# Patient Record
Sex: Female | Born: 1985 | Race: Black or African American | Hispanic: No | Marital: Single | Smoking: Never smoker
Health system: Southern US, Community
[De-identification: ages and names within clinical notes are randomized; demographics above are authoritative.]

## PROBLEM LIST (undated history)

## (undated) ENCOUNTER — Inpatient Hospital Stay (HOSPITAL_COMMUNITY): Payer: Self-pay

## (undated) DIAGNOSIS — F329 Major depressive disorder, single episode, unspecified: Secondary | ICD-10-CM

## (undated) DIAGNOSIS — G43909 Migraine, unspecified, not intractable, without status migrainosus: Secondary | ICD-10-CM

## (undated) DIAGNOSIS — F32A Depression, unspecified: Secondary | ICD-10-CM

## (undated) DIAGNOSIS — F419 Anxiety disorder, unspecified: Secondary | ICD-10-CM

## (undated) HISTORY — PX: NO PAST SURGERIES: SHX2092

---

## 2009-05-20 ENCOUNTER — Inpatient Hospital Stay (HOSPITAL_COMMUNITY): Admission: AD | Admit: 2009-05-20 | Discharge: 2009-05-20 | Payer: Self-pay | Admitting: Obstetrics & Gynecology

## 2009-06-05 ENCOUNTER — Inpatient Hospital Stay (HOSPITAL_COMMUNITY): Admission: AD | Admit: 2009-06-05 | Discharge: 2009-06-05 | Payer: Self-pay | Admitting: Obstetrics & Gynecology

## 2009-06-05 ENCOUNTER — Ambulatory Visit: Payer: Self-pay | Admitting: Advanced Practice Midwife

## 2009-10-21 ENCOUNTER — Ambulatory Visit: Payer: Self-pay | Admitting: Advanced Practice Midwife

## 2009-10-21 ENCOUNTER — Other Ambulatory Visit: Payer: Self-pay | Admitting: Obstetrics & Gynecology

## 2009-10-21 ENCOUNTER — Inpatient Hospital Stay (HOSPITAL_COMMUNITY): Admission: AD | Admit: 2009-10-21 | Discharge: 2009-10-21 | Payer: Self-pay | Admitting: Obstetrics & Gynecology

## 2009-10-27 ENCOUNTER — Inpatient Hospital Stay (HOSPITAL_COMMUNITY): Admission: AD | Admit: 2009-10-27 | Discharge: 2009-10-27 | Payer: Self-pay | Admitting: Obstetrics & Gynecology

## 2009-10-28 ENCOUNTER — Inpatient Hospital Stay (HOSPITAL_COMMUNITY): Admission: AD | Admit: 2009-10-28 | Discharge: 2009-10-28 | Payer: Self-pay | Admitting: Obstetrics & Gynecology

## 2009-10-28 ENCOUNTER — Ambulatory Visit: Payer: Self-pay | Admitting: Family Medicine

## 2009-10-29 ENCOUNTER — Inpatient Hospital Stay (HOSPITAL_COMMUNITY): Admission: AD | Admit: 2009-10-29 | Discharge: 2009-10-30 | Payer: Self-pay | Admitting: Obstetrics & Gynecology

## 2009-10-30 ENCOUNTER — Inpatient Hospital Stay (HOSPITAL_COMMUNITY): Admission: AD | Admit: 2009-10-30 | Discharge: 2009-10-30 | Payer: Self-pay | Admitting: Obstetrics and Gynecology

## 2009-10-30 ENCOUNTER — Ambulatory Visit: Payer: Self-pay | Admitting: Obstetrics and Gynecology

## 2009-10-31 ENCOUNTER — Inpatient Hospital Stay (HOSPITAL_COMMUNITY): Admission: AD | Admit: 2009-10-31 | Discharge: 2009-11-03 | Payer: Self-pay | Admitting: Obstetrics & Gynecology

## 2009-10-31 ENCOUNTER — Ambulatory Visit: Payer: Self-pay | Admitting: Obstetrics and Gynecology

## 2011-04-01 LAB — CBC
HCT: 33.8 % — ABNORMAL LOW (ref 36.0–46.0)
Hemoglobin: 8.7 g/dL — ABNORMAL LOW (ref 12.0–15.0)
MCHC: 33.8 g/dL (ref 30.0–36.0)
MCHC: 33.9 g/dL (ref 30.0–36.0)
MCV: 85.2 fL (ref 78.0–100.0)
MCV: 86 fL (ref 78.0–100.0)
RBC: 2.99 MIL/uL — ABNORMAL LOW (ref 3.87–5.11)
RBC: 3.97 MIL/uL (ref 3.87–5.11)
WBC: 12.9 10*3/uL — ABNORMAL HIGH (ref 4.0–10.5)

## 2011-04-01 LAB — RPR: RPR Ser Ql: NONREACTIVE

## 2011-04-02 LAB — CBC
HCT: 32.7 % — ABNORMAL LOW (ref 36.0–46.0)
WBC: 8.8 10*3/uL (ref 4.0–10.5)

## 2011-04-02 LAB — URINE CULTURE

## 2011-04-02 LAB — URINALYSIS, ROUTINE W REFLEX MICROSCOPIC
Specific Gravity, Urine: <1.005 — ABNORMAL LOW (ref 1.005–1.030)
Urobilinogen, UA: 0.2 mg/dL (ref 0.0–1.0)

## 2011-04-02 LAB — DIFFERENTIAL
Basophils Relative: 0 % (ref 0–1)
Lymphocytes Relative: 13 % (ref 12–46)
Monocytes Relative: 9 % (ref 3–12)
Neutrophils Relative %: 75 % (ref 43–77)

## 2011-04-02 LAB — RAPID URINE DRUG SCREEN, HOSP PERFORMED
Amphetamines: NOT DETECTED
Barbiturates: NOT DETECTED
Benzodiazepines: NOT DETECTED
Cocaine: NOT DETECTED
Opiates: NOT DETECTED

## 2011-04-02 LAB — TYPE AND SCREEN: ABO/RH(D): O POS

## 2011-04-02 LAB — SICKLE CELL SCREEN: Sickle Cell Screen: NEGATIVE

## 2011-04-02 LAB — HEPATITIS B SURFACE ANTIGEN: Hepatitis B Surface Ag: NEGATIVE

## 2011-04-02 LAB — URINE MICROSCOPIC-ADD ON

## 2011-04-02 LAB — WET PREP, GENITAL: Trich, Wet Prep: NONE SEEN

## 2011-04-02 LAB — RUBELLA SCREEN: Rubella: 18.7 IU/mL — ABNORMAL HIGH

## 2011-04-02 LAB — RPR: RPR Ser Ql: NONREACTIVE

## 2011-04-02 LAB — STREP B DNA PROBE

## 2011-04-02 LAB — RAPID HIV SCREEN (WH-MAU): Rapid HIV Screen: NONREACTIVE

## 2011-04-02 LAB — GC/CHLAMYDIA PROBE AMP, GENITAL
Chlamydia, DNA Probe: POSITIVE — AB
GC Probe Amp, Genital: NEGATIVE

## 2011-04-07 LAB — URINALYSIS, ROUTINE W REFLEX MICROSCOPIC
Bilirubin Urine: NEGATIVE
Glucose, UA: NEGATIVE mg/dL
Hgb urine dipstick: NEGATIVE
Specific Gravity, Urine: 1.015 (ref 1.005–1.030)

## 2011-04-07 LAB — WET PREP, GENITAL

## 2015-06-06 ENCOUNTER — Emergency Department (HOSPITAL_COMMUNITY)
Admission: EM | Admit: 2015-06-06 | Discharge: 2015-06-06 | Disposition: A | Discharge status: Home / Self Care | Payer: Medicaid Other | Attending: Emergency Medicine | Admitting: Emergency Medicine

## 2015-06-06 ENCOUNTER — Encounter (HOSPITAL_COMMUNITY): Payer: Self-pay | Admitting: Emergency Medicine

## 2015-06-06 DIAGNOSIS — R51 Headache: Secondary | ICD-10-CM | POA: Diagnosis present

## 2015-06-06 DIAGNOSIS — G43011 Migraine without aura, intractable, with status migrainosus: Secondary | ICD-10-CM | POA: Diagnosis not present

## 2015-06-06 HISTORY — DX: Migraine, unspecified, not intractable, without status migrainosus: G43.909

## 2015-06-06 MED ORDER — DIPHENHYDRAMINE HCL 50 MG/ML IJ SOLN
25.0000 mg | Freq: Once | INTRAMUSCULAR | Status: AC
  Administered 2015-06-06: 25 mg via INTRAVENOUS
  Filled 2015-06-06: qty 1

## 2015-06-06 MED ORDER — KETOROLAC TROMETHAMINE 30 MG/ML IJ SOLN
30.0000 mg | Freq: Once | INTRAMUSCULAR | Status: AC
  Administered 2015-06-06: 30 mg via INTRAVENOUS
  Filled 2015-06-06: qty 1

## 2015-06-06 MED ORDER — METOCLOPRAMIDE HCL 5 MG/ML IJ SOLN
10.0000 mg | Freq: Once | INTRAMUSCULAR | Status: AC
  Administered 2015-06-06: 10 mg via INTRAVENOUS
  Filled 2015-06-06: qty 2

## 2015-06-06 MED ORDER — DEXAMETHASONE SODIUM PHOSPHATE 10 MG/ML IJ SOLN
10.0000 mg | Freq: Once | INTRAMUSCULAR | Status: AC
  Administered 2015-06-06: 10 mg via INTRAVENOUS
  Filled 2015-06-06: qty 1

## 2015-06-06 NOTE — ED Notes (Signed)
Per EMS: pt c/o HA on both sides that is similar to severe migraine that pt has hx of; pt alert at present; pt sts took excedrin without relief

## 2015-06-06 NOTE — Discharge Instructions (Signed)

## 2015-06-06 NOTE — ED Provider Notes (Addendum)
CSN: [id number]     Arrival date & time 06/06/15  1238 History   First MD Initiated Contact with Patient 06/06/15 1248     Chief Complaint  Patient presents with  . Headache     (Consider location/radiation/quality/duration/timing/severity/associated sxs/prior Treatment) Patient is a 29 y.o. female presenting with headaches. The history is provided by the patient.  Headache Pain location:  Generalized Quality:  Dull Radiates to:  Does not radiate Severity currently:  10/10 Severity at highest:  10/10 Onset quality:  Gradual Duration:  1 day Timing:  Constant Progression:  Worsening Chronicity:  Recurrent Similar to prior headaches: yes   Context: bright light   Relieved by:  Nothing Worsened by:  Light and sound Ineffective treatments: excedrin. Associated symptoms: nausea and photophobia   Associated symptoms: no abdominal pain, no back pain, no blurred vision, no fever, no focal weakness, no visual change and no vomiting   Risk factors comment:  Hx of migraines   Past Medical History  Diagnosis Date  . Migraine headache    History reviewed. No pertinent past surgical history. History reviewed. No pertinent family history. History  Substance Use Topics  . Smoking status: Never Smoker   . Smokeless tobacco: Not on file  . Alcohol Use: No   OB History    No data available     Review of Systems  Constitutional: Negative for fever.  Eyes: Positive for photophobia. Negative for blurred vision.  Gastrointestinal: Positive for nausea. Negative for vomiting and abdominal pain.  Musculoskeletal: Negative for back pain.  Neurological: Positive for headaches. Negative for focal weakness.  All other systems reviewed and are negative.     Allergies  Review of patient's allergies indicates no known allergies.  Home Medications   Prior to Admission medications   Not on File   BP 110/75 mmHg  Temp(Src) 97.9 F (36.6 C) (Oral)  Resp 21  SpO2 100% Physical Exam   Constitutional: She is oriented to person, place, and time. She appears well-developed and well-nourished. No distress.  HENT:  Head: Normocephalic and atraumatic.  Mouth/Throat: Oropharynx is clear and moist.  Eyes: Conjunctivae and EOM are normal. Pupils are equal, round, and reactive to light. Right eye exhibits no discharge. Left eye exhibits no discharge.  photophobia  Neck: Normal range of motion. Neck supple. No spinous process tenderness present. No rigidity. No Brudzinski's sign and no Kernig's sign noted.  Cardiovascular: Normal rate, normal heart sounds and intact distal pulses.   No murmur heard. Pulmonary/Chest: Effort normal and breath sounds normal. No respiratory distress. She has no wheezes. She has no rales.  Abdominal: Soft. She exhibits no distension. There is no tenderness.  Musculoskeletal: Normal range of motion. She exhibits no edema or tenderness.  Lymphadenopathy:    She has no cervical adenopathy.  Neurological: She is alert and oriented to person, place, and time. She has normal strength. No cranial nerve deficit or sensory deficit. Coordination and gait normal. GCS eye subscore is 4. GCS verbal subscore is 5. GCS motor subscore is 6.  Skin: Skin is warm and dry.  Psychiatric: She has a normal mood and affect. Her behavior is normal.  Nursing note and vitals reviewed.   ED Course  Procedures (including critical care time) Labs Review Labs Reviewed - No data to display  Imaging Review No results found.   EKG Interpretation None      MDM   Final diagnoses:  Intractable migraine without aura and with status migrainosus    Pt  with typical migraine HA without sx suggestive of SAH(sudden onset, worst of life, or deficits), infection, or cavernous vein thrombosis.  Normal neuro exam and vital signs. Will give HA cocktail and on re-eval.  2:54 PM Pt feeling much better after headache cocktail.  Gwyneth Sprout, MD 06/06/15 1455  Gwyneth Sprout,  MD 06/06/15 1457

## 2015-07-15 ENCOUNTER — Encounter (HOSPITAL_COMMUNITY): Payer: Self-pay | Admitting: Emergency Medicine

## 2015-07-15 ENCOUNTER — Emergency Department (HOSPITAL_COMMUNITY): Payer: Medicaid Other

## 2015-07-15 ENCOUNTER — Emergency Department (HOSPITAL_COMMUNITY)
Admission: EM | Admit: 2015-07-15 | Discharge: 2015-07-16 | Disposition: A | Discharge status: Home / Self Care | Payer: Medicaid Other | Attending: Emergency Medicine | Admitting: Emergency Medicine

## 2015-07-15 DIAGNOSIS — N730 Acute parametritis and pelvic cellulitis: Secondary | ICD-10-CM | POA: Insufficient documentation

## 2015-07-15 DIAGNOSIS — Z3202 Encounter for pregnancy test, result negative: Secondary | ICD-10-CM | POA: Diagnosis not present

## 2015-07-15 DIAGNOSIS — Z8679 Personal history of other diseases of the circulatory system: Secondary | ICD-10-CM | POA: Diagnosis not present

## 2015-07-15 DIAGNOSIS — R109 Unspecified abdominal pain: Secondary | ICD-10-CM

## 2015-07-15 DIAGNOSIS — R1032 Left lower quadrant pain: Secondary | ICD-10-CM | POA: Diagnosis present

## 2015-07-15 DIAGNOSIS — R000 Tachycardia, unspecified: Secondary | ICD-10-CM | POA: Diagnosis not present

## 2015-07-15 HISTORY — DX: Acute parametritis and pelvic cellulitis: N73.0

## 2015-07-15 LAB — WET PREP, GENITAL
Trich, Wet Prep: NONE SEEN
Yeast Wet Prep HPF POC: NONE SEEN

## 2015-07-15 LAB — CBC WITH DIFFERENTIAL/PLATELET
Basophils Absolute: 0 10*3/uL (ref 0.0–0.1)
Basophils Relative: 0 % (ref 0–1)
EOS ABS: 0 10*3/uL (ref 0.0–0.7)
Eosinophils Relative: 0 % (ref 0–5)
HEMATOCRIT: 35.5 % — AB (ref 36.0–46.0)
HEMOGLOBIN: 11.7 g/dL — AB (ref 12.0–15.0)
LYMPHS PCT: 5 % — AB (ref 12–46)
Lymphs Abs: 0.8 10*3/uL (ref 0.7–4.0)
MCH: 27.2 pg (ref 26.0–34.0)
MCHC: 33 g/dL (ref 30.0–36.0)
MCV: 82.6 fL (ref 78.0–100.0)
Monocytes Absolute: 1.2 10*3/uL — ABNORMAL HIGH (ref 0.1–1.0)
Monocytes Relative: 8 % (ref 3–12)
Neutro Abs: 12.5 10*3/uL — ABNORMAL HIGH (ref 1.7–7.7)
Neutrophils Relative %: 87 % — ABNORMAL HIGH (ref 43–77)
Platelets: 185 10*3/uL (ref 150–400)
RBC: 4.3 MIL/uL (ref 3.87–5.11)
RDW: 14.7 % (ref 11.5–15.5)
WBC: 14.5 10*3/uL — ABNORMAL HIGH (ref 4.0–10.5)

## 2015-07-15 LAB — URINALYSIS, ROUTINE W REFLEX MICROSCOPIC
BILIRUBIN URINE: NEGATIVE
Glucose, UA: NEGATIVE mg/dL
Ketones, ur: NEGATIVE mg/dL
NITRITE: POSITIVE — AB
PH: 5.5 (ref 5.0–8.0)
Protein, ur: NEGATIVE mg/dL
Urobilinogen, UA: 0.2 mg/dL (ref 0.0–1.0)

## 2015-07-15 LAB — COMPREHENSIVE METABOLIC PANEL
ALBUMIN: 3.8 g/dL (ref 3.5–5.0)
ALT: 10 U/L — AB (ref 14–54)
ANION GAP: 7 (ref 5–15)
AST: 15 U/L (ref 15–41)
Alkaline Phosphatase: 65 U/L (ref 38–126)
BILIRUBIN TOTAL: 0.7 mg/dL (ref 0.3–1.2)
BUN: <5 mg/dL — ABNORMAL LOW (ref 6–20)
CHLORIDE: 105 mmol/L (ref 101–111)
CO2: 26 mmol/L (ref 22–32)
Calcium: 9.5 mg/dL (ref 8.9–10.3)
Creatinine, Ser: 0.88 mg/dL (ref 0.44–1.00)
GFR calc non Af Amer: >60 mL/min (ref >60)
GLUCOSE: 104 mg/dL — AB (ref 65–99)
Potassium: 3.4 mmol/L — ABNORMAL LOW (ref 3.5–5.1)
SODIUM: 138 mmol/L (ref 135–145)
Total Protein: 7.6 g/dL (ref 6.5–8.1)

## 2015-07-15 LAB — I-STAT BETA HCG BLOOD, ED (MC, WL, AP ONLY): I-stat hCG, quantitative: <5 m[IU]/mL (ref <5)

## 2015-07-15 LAB — URINE MICROSCOPIC-ADD ON

## 2015-07-15 LAB — I-STAT CG4 LACTIC ACID, ED: LACTIC ACID, VENOUS: 1.79 mmol/L (ref 0.5–2.0)

## 2015-07-15 MED ORDER — SODIUM CHLORIDE 0.9 % IV BOLUS (SEPSIS)
1000.0000 mL | Freq: Once | INTRAVENOUS | Status: AC
  Administered 2015-07-15: 1000 mL via INTRAVENOUS

## 2015-07-15 MED ORDER — DEXTROSE 5 % IV SOLN
100.0000 mg | Freq: Once | INTRAVENOUS | Status: AC
  Administered 2015-07-15: 100 mg via INTRAVENOUS
  Filled 2015-07-15: qty 100

## 2015-07-15 MED ORDER — OXYCODONE-ACETAMINOPHEN 5-325 MG PO TABS
2.0000 | ORAL_TABLET | Freq: Once | ORAL | Status: AC
  Administered 2015-07-15: 2 via ORAL
  Filled 2015-07-15: qty 2

## 2015-07-15 MED ORDER — ONDANSETRON HCL 4 MG/2ML IJ SOLN
4.0000 mg | Freq: Once | INTRAMUSCULAR | Status: AC
  Administered 2015-07-15: 4 mg via INTRAVENOUS
  Filled 2015-07-15: qty 2

## 2015-07-15 MED ORDER — IOHEXOL 300 MG/ML  SOLN: 25.0000 mL | Freq: Once | INTRAMUSCULAR | Status: AC | PRN

## 2015-07-15 MED ORDER — DEXTROSE 5 % IV SOLN
2.0000 g | Freq: Once | INTRAVENOUS | Status: AC
  Administered 2015-07-15: 2 g via INTRAVENOUS
  Filled 2015-07-15: qty 2

## 2015-07-15 MED ORDER — ONDANSETRON HCL 4 MG PO TABS
4.0000 mg | ORAL_TABLET | Freq: Once | ORAL | Status: AC
  Administered 2015-07-15: 4 mg via ORAL
  Filled 2015-07-15: qty 1

## 2015-07-15 MED ORDER — IOHEXOL 300 MG/ML  SOLN
100.0000 mL | Freq: Once | INTRAMUSCULAR | Status: AC | PRN
  Administered 2015-07-15: 100 mL via INTRAVENOUS

## 2015-07-15 MED ORDER — IBUPROFEN 200 MG PO TABS
600.0000 mg | ORAL_TABLET | Freq: Once | ORAL | Status: AC
  Administered 2015-07-15: 600 mg via ORAL
  Filled 2015-07-15: qty 3

## 2015-07-15 MED ORDER — DEXTROSE 5 % IV SOLN
1.0000 g | Freq: Once | INTRAVENOUS | Status: DC
  Filled 2015-07-15: qty 10

## 2015-07-15 MED ORDER — HYDROMORPHONE HCL 1 MG/ML IJ SOLN
1.0000 mg | Freq: Once | INTRAMUSCULAR | Status: AC
  Administered 2015-07-15: 1 mg via INTRAVENOUS
  Filled 2015-07-15: qty 1

## 2015-07-15 NOTE — ED Provider Notes (Signed)
  Physical Exam  BP 112/61 mmHg  Pulse 93  Temp(Src) 98.8 F (37.1 C) (Oral)  Resp 20  Ht 5\' 4"  (1.626 m)  Wt 200 lb (90.719 kg)  BMI 34.31 kg/m2  SpO2 95%  LMP 07/03/2015  Physical Exam  Constitutional: She is oriented to person, place, and time. She appears well-developed and well-nourished. No distress.  HENT:  Head: Normocephalic and atraumatic.  Mouth/Throat: Oropharynx is clear and moist. No oropharyngeal exudate.  Eyes: Right eye exhibits no discharge. Left eye exhibits no discharge. No scleral icterus.  Neck: Normal range of motion.  Cardiovascular: Normal rate, regular rhythm and normal heart sounds.   No murmur heard. Pulmonary/Chest: Effort normal and breath sounds normal. No respiratory distress.  Abdominal: Soft. There is tenderness in the left lower quadrant.  Genitourinary:  Pelvic exam deferred-- done by previous provider during this visit.  Musculoskeletal: Normal range of motion. She exhibits no edema or tenderness.  Neurological: She is alert and oriented to person, place, and time. No cranial nerve deficit. Coordination normal.  Skin: Skin is warm and dry. No rash noted. She is not diaphoretic.  Psychiatric: She has a normal mood and affect.    ED Course  Procedures  MDM Patient signed out to me by Rhea BleacherJosh Geiple, PA-C with plan to follow-up on pelvic ultrasound, possibly admit for symptomatic control.  Patient is a 29 year old female who presented to the ER, and has had workup for left lower quadrant abdominal pain and fever. Patient's workup so far shows patient to have a leukocytosis of 14, initially with a fever of 103. Clinical appearance favor his PID versus pyelonephritis despite mild focal pyelonephritis on CT. Pelvic exam was remarkable for significant cervical motion tenderness. This is correlated with CT abdomen pelvis findings of free fluid in adnexal regions representing possible hydrator salpinx. Patient is followed up with pelvic ultrasound to rule  out TOA.  Pelvic ultrasound impression: 1. Findings support pelvic inflammatory disease. There is moderate amount pelvic free fluid. There is a heterogeneous echogenicity focal area adjacent to the left ovary with increased blood flow appears inflammatory. No defined tubo-ovarian abscess. No evidence of a dilated fallopian tube. 2. Normal uterus. Dominant complex appearing cyst in the right ovary likely involuting/partly ruptured follicular cyst or hemorrhagic cyst. 3. Normal uterus.  I discussed patient's case, laboratory findings, imaging and exam with OB/GYN Dr. Lynetta MareAnyawu MD who recommends to discharge patient from the ED to home on PO abx if pt is tolerating PO well.    Pt received IV doxycycline and Cefoxitin, tolerating PO well.  Symptoms effectively managed here.  VS have improved, fever is down.  No concern for sepsis or SIRS at this point.  Pt hemodynamically stable and in no acute distress.  Pt stable for discharge.  Pt discharged with symptomatic therapy and doxycycline BID x14 days.  Pt strongly encouraged [facility].  Strict return precautions discussed, pt verbalized understanding and agreement of this plan.    BP 112/61 mmHg  Pulse 93  Temp(Src) 98.8 F (37.1 C) (Oral)  Resp 20  Ht 5\' 4"  (1.626 m)  Wt 200 lb (90.719 kg)  BMI 34.31 kg/m2  SpO2 95%  LMP 07/03/2015  Signed,  Ladona MowJoe Jesiah Yerby, PA-C 12:46 AM  Patient seen and discussed with Dr. Derwood KaplanAnkit Nanavati, MD      Ladona MowJoe Diem Dicocco, PA-C 07/16/15 [phone]

## 2015-07-15 NOTE — ED Notes (Signed)
Patient returned to Xray.

## 2015-07-15 NOTE — ED Provider Notes (Signed)
CSN: [id number]     Arrival date & time 07/15/15  1310 History   First MD Initiated Contact with Patient 07/15/15 1357     Chief Complaint  Patient presents with  . Abdominal Pain     (Consider location/radiation/quality/duration/timing/severity/associated sxs/prior Treatment) HPI Comments: Patient with no past surgical hsitory -- presents with c/o abdominal pain which began acutely 1 week ago as a mild LLQ pain. Over the week, pain gradually worsened and became severe this morning. Pain is now all over her abdomen. She has developed a fever and has associated nausea, but denies V/D. Also associated HA. No urinary sx. LMP 07/03/15 and was normal for her. Last sexual intercourse was 4/16 and she denies vaginal bleeding or discharge. Patient takes ibuprofen about 2x/day and this has provided some relief. No h/o PUD, gallbladder disease, kidney stones. Patient states she has been ignoring her symptoms because she has two small children to care for at home and today could no longer take the symptoms.   The history is provided by the patient.    Past Medical History  Diagnosis Date  . Migraine headache    History reviewed. No pertinent past surgical history. No family history on file. History  Substance Use Topics  . Smoking status: Never Smoker   . Smokeless tobacco: Not on file  . Alcohol Use: No   OB History    No data available     Review of Systems  Constitutional: Positive for fever and chills.  HENT: Negative for rhinorrhea and sore throat.   Eyes: Negative for redness.  Respiratory: Negative for cough.   Cardiovascular: Negative for chest pain.  Gastrointestinal: Positive for nausea and abdominal pain. Negative for vomiting and diarrhea.  Genitourinary: Negative for dysuria, frequency, hematuria, vaginal bleeding and vaginal discharge.  Musculoskeletal: Negative for myalgias.  Skin: Negative for rash.  Neurological: Negative for headaches.      Allergies  Review of  patient's allergies indicates no known allergies.  Home Medications   Prior to Admission medications   Medication Sig Start Date End Date Taking? Authorizing Provider  ibuprofen (ADVIL,MOTRIN) 200 MG tablet Take 600 mg by mouth every 6 (six) hours as needed for mild pain or moderate pain.   Yes Historical Provider, MD   BP 113/58 mmHg  Pulse 102  Temp(Src) 102.6 F (39.2 C) (Oral)  Resp 22  Ht 5\' 4"  (1.626 m)  Wt 200 lb (90.719 kg)  BMI 34.31 kg/m2  SpO2 99%  LMP 07/03/2015 Physical Exam  Constitutional: She appears well-developed and well-nourished. She appears distressed (crying during exam).  HENT:  Head: Normocephalic and atraumatic.  Mouth/Throat: Oropharynx is clear and moist.  Eyes: Conjunctivae are normal. Right eye exhibits no discharge. Left eye exhibits no discharge.  Neck: Normal range of motion. Neck supple.  Cardiovascular: Regular rhythm and normal heart sounds.  Tachycardia present.   Pulmonary/Chest: Effort normal and breath sounds normal. No respiratory distress. She has no wheezes. She has no rales.  Abdominal: Soft. She exhibits no distension. There is tenderness. There is no rebound and no guarding.  Generalized moderate to severe pain  Genitourinary: Uterus is tender. Cervix exhibits motion tenderness and discharge. Cervix exhibits no friability. Right adnexum displays tenderness. Right adnexum displays no mass. Left adnexum displays tenderness. Left adnexum displays no mass. No erythema in the vagina. Vaginal discharge (thick white) found.  L > R adnexal tenderness with + CMT  Neurological: She is alert.  Skin: Skin is warm and dry.  Psychiatric: She  has a normal mood and affect.  Nursing note and vitals reviewed.   ED Course  Procedures (including critical care time) Labs Review Labs Reviewed  CBC WITH DIFFERENTIAL/PLATELET - Abnormal; Notable for the following:    WBC 14.5 (*)    Hemoglobin 11.7 (*)    HCT 35.5 (*)    Neutrophils Relative % 87 (*)     Neutro Abs 12.5 (*)    Lymphocytes Relative 5 (*)    Monocytes Absolute 1.2 (*)    All other components within normal limits  COMPREHENSIVE METABOLIC PANEL - Abnormal; Notable for the following:    Potassium 3.4 (*)    Glucose, Bld 104 (*)    BUN <5 (*)    ALT 10 (*)    All other components within normal limits  CULTURE, BLOOD (ROUTINE X 2)  CULTURE, BLOOD (ROUTINE X 2)  WET PREP, GENITAL  URINE CULTURE  URINALYSIS, ROUTINE W REFLEX MICROSCOPIC (NOT AT [facility])  LIPASE, BLOOD  I-STAT CG4 LACTIC ACID, ED  I-STAT BETA HCG BLOOD, ED ([facility], [facility], [facility] ONLY)  POC URINE PREG, ED  GC/CHLAMYDIA PROBE AMP ([employer]) NOT AT [facility]    Imaging Review Ct Abdomen Pelvis W Contrast  07/15/2015   CLINICAL DATA:  Left-sided abdominal pain and headache.  Nausea.  EXAM: CT ABDOMEN AND PELVIS WITH CONTRAST  TECHNIQUE: Multidetector CT imaging of the abdomen and pelvis was performed using the standard protocol following bolus administration of intravenous contrast.  CONTRAST:  OMNIPAQUE IOHEXOL 300 MG/ML  SOLN  COMPARISON:  None.  FINDINGS: Lower chest:  Normal.  Hepatobiliary: Normal.  Pancreas: Normal.  Spleen: Normal.  Adrenals/Urinary Tract: There is a 2 cm area of abnormal poorly defined low-density at the anterior aspect of the lower pole of the left kidney with soft tissue stranding of the perinephric fat and along the inferior aspect of Gerota's fascia consistent with focal pyelonephritis.  The adrenal glands and right kidney are normal. Bladder is normal. Ureters are not distended.  Stomach/Bowel: Normal.  Vascular/Lymphatic: Normal.  Reproductive: There is a small amount free fluid in the pelvis with areas of fluid density in the adnexa. The possibility of hydrosalpinx should be considered. Uterus is normal.  Other: No free air.  Musculoskeletal: Normal.  IMPRESSION: 1. Focal pyelonephritis of the lower pole of the left kidney. 2. Small amount of free fluid in the pelvis with fluid in the adnexal  regions which may represent hydrosalpinges.   Electronically Signed   By: Francene Boyers M.D.   On: 07/15/2015 16:04     EKG Interpretation None       3:37 PM Patient seen and examined. Work-up initiated. Medications ordered.   Vital signs reviewed and are as follows: BP 113/58 mmHg  Pulse 102  Temp(Src) 102.6 F (39.2 C) (Oral)  Resp 22  Ht  (1.626 m)  Wt 200 lb (90.719 kg)  BMI 34.31 kg/m2  SpO2 99%  LMP 07/03/2015  4:56 PM Pelvic performed with nurse chaperone.   Handoff to [facility] PA-C/Nanavati at shift change. Suspect PID. [place] ordered to eval for TOA. May need admission for severe PID. Feel pyelo less likely.   MDM   Final diagnoses:  Abdominal pain  PID (acute pelvic inflammatory disease)   Pending completion of work-up.     Renne Crigler, PA-C 07/15/15 1658  Mancel Bale, MD 07/15/15 [phone]

## 2015-07-15 NOTE — ED Notes (Signed)
Patient states abdominal pain and headache x 1 week.   Patient states she took ibuprofen and it eased some of the pain.   Patient states has had some nausea, but denies vomiting and diarrhea.

## 2015-07-16 LAB — GC/CHLAMYDIA PROBE AMP (~~LOC~~) NOT AT ARMC
Chlamydia: POSITIVE — AB
Neisseria Gonorrhea: NEGATIVE

## 2015-07-16 MED ORDER — DOXYCYCLINE HYCLATE 100 MG PO CAPS: 100.0000 mg | ORAL_CAPSULE | Freq: Two times a day (BID) | ORAL | Status: DC

## 2015-07-16 MED ORDER — OXYCODONE-ACETAMINOPHEN 5-325 MG PO TABS: 1.0000 | ORAL_TABLET | Freq: Four times a day (QID) | ORAL | Status: DC | PRN

## 2015-07-16 MED ORDER — ONDANSETRON HCL 4 MG PO TABS: 4.0000 mg | ORAL_TABLET | Freq: Four times a day (QID) | ORAL | Status: DC

## 2015-07-16 NOTE — ED Notes (Signed)
Patient is alert and orientedx4.  Patient was explained discharge instructions and they understood them with no questions.   

## 2015-07-16 NOTE — Discharge Instructions (Signed)
Pelvic Inflammatory Disease °Pelvic inflammatory disease (PID) refers to an infection in some or all of the female organs. The infection can be in the uterus, ovaries, fallopian tubes, or the surrounding tissues in the pelvis. PID can cause abdominal or pelvic pain that comes on suddenly (acute pelvic pain). PID is a serious infection because it can lead to lasting (chronic) pelvic pain or the inability to have children (infertile).  °CAUSES  °The infection is often caused by the normal bacteria found in the vaginal tissues. PID may also be caused by an infection that is spread during sexual contact. PID can also occur following:  °· The birth of a baby.   °· A miscarriage.   °· An abortion.   °· Major pelvic surgery.   °· The use of an intrauterine device (IUD).   °· A sexual assault.   °RISK FACTORS °Certain factors can put a person at higher risk for PID, such as: °· Being younger than 25 years. °· Being sexually active at a young age. °· Using nonbarrier contraception. °· Having multiple sexual partners. °· Having sex with someone who has symptoms of a genital infection. °· Using oral contraception. °Other times, certain behaviors can increase the possibility of getting PID, such as: °· Having sex during your period. °· Using a vaginal douche. °· Having an intrauterine device (IUD) in place. °SYMPTOMS  °· Abdominal or pelvic pain.   °· Fever.   °· Chills.   °· Abnormal vaginal discharge. °· Abnormal uterine bleeding.   °· Unusual pain shortly after finishing your period. °DIAGNOSIS  °Your caregiver will choose some of the following methods to make a diagnosis, such as:  °· Performing a physical exam and history. A pelvic exam typically reveals a very tender uterus and surrounding pelvis.   °· Ordering laboratory tests including a pregnancy test, blood tests, and urine test.  °· Ordering cultures of the vagina and cervix to check for a sexually transmitted infection (STI). °· Performing an ultrasound.    °· Performing a laparoscopic procedure to look inside the pelvis.   °TREATMENT  °· Antibiotic medicines may be prescribed and taken by mouth.   °· Sexual partners may be treated when the infection is caused by a sexually transmitted disease (STD).   °· Hospitalization may be needed to give antibiotics intravenously. °· Surgery may be needed, but this is rare. °It may take weeks until you are completely well. If you are diagnosed with PID, you should also be checked for human immunodeficiency virus (HIV).   °HOME CARE INSTRUCTIONS  °· If given, take your antibiotics as directed. Finish the medicine even if you start to feel better.   °· Only take over-the-counter or prescription medicines for pain, discomfort, or fever as directed by your caregiver.   °· Do not have sexual intercourse until treatment is completed or as directed by your caregiver. If PID is confirmed, your recent sexual partner(s) will need treatment.   °· Keep your follow-up appointments. °SEEK MEDICAL CARE IF:  °· You have increased or abnormal vaginal discharge.   °· You need prescription medicine for your pain.   °· You vomit.   °· You cannot take your medicines.   °· Your partner has an STD.   °SEEK IMMEDIATE MEDICAL CARE IF:  °· You have a fever.   °· You have increased abdominal or pelvic pain.   °· You have chills.   °· You have pain when you urinate.   °· You are not better after 72 hours following treatment.   °MAKE SURE YOU:  °· Understand these instructions. °· Will watch your condition. °· Will get help right away if you are not doing well or get worse. °  Document Released: 12/14/2005 Document Revised: 04/10/2013 Document Reviewed: 12/10/2011 °ExitCare® Patient Information ©2015 [employer]. This information is not intended to replace advice given to you by your health care provider. Make sure you discuss any questions you have with your health care provider. ° ° °Emergency Department Resource Guide °1) Find a Doctor and Pay Out of  Pocket °Although you won't have to find out who is covered by your insurance plan, it is a good idea to ask around and get recommendations. You will then need to call the office and see if the doctor you have chosen will accept you as a new patient and what types of options they offer for patients who are self-pay. Some doctors offer discounts or will set up payment plans for their patients who do not have insurance, but you will need to ask so you aren't surprised when you get to your appointment. ° °2) Contact Your Local Health Department °Not all health departments have doctors that can see patients for sick visits, but many do, so it is worth a call to see if yours does. If you don't know where your local health department is, you can check in your phone book. The CDC also has a tool to help you locate your state's health department, and many state websites also have listings of all of their local health departments. ° °3) Find a Walk-in Clinic °If your illness is not likely to be very severe or complicated, you may want to try a walk in clinic. These are popping up all over the country in pharmacies, drugstores, and shopping centers. They're usually staffed by nurse practitioners or physician assistants that have been trained to treat common illnesses and complaints. They're usually fairly quick and inexpensive. However, if you have serious medical issues or chronic medical problems, these are probably not your best option. ° °No Primary Care Doctor: °- Call [facility] at  832-8000 - they can help you locate a primary care doctor that  accepts your insurance, provides certain services, etc. °- Physician Referral Service- 1-800-533-3463 ° °Chronic Pain Problems: °Organization         Address  Phone   Notes  °[facility]  (336) 297-2271 Patients need to be referred by their primary care doctor.  ° °Medication Assistance: °Organization         Address  Phone   Notes  °[place]  [facility] [address] °[place], [place] [address] (336) 641-8030 --Must be a resident of [place] °-- Must have NO insurance coverage whatsoever (no Medicaid/ Medicare, etc.) °-- The pt. MUST have a primary care doctor that directs their care regularly and follows them in the community °  °[facility]  (866) 331-1348   °[employer]  (888) 892-1162   ° °Agencies that provide inexpensive medical care: °Organization         Address  Phone   Notes  °[facility]  (336) 832-8035   °[facility]    (336) 832-7272   °[facility] [address] °[place], [place] [address] (336) 832-4777   °[facility] [address], °[place] (336) 271-4999   °[facility]    (336) 373-0678   °[facility]    (336) 272-1050   °[facility] ° [address], [place] Phone:  (336) 832-4444, Fax:  (336) 832-4440 Hours of Operation:  9 am - 6 pm, M-F.    Also accepts Medicaid/Medicare and self-pay.  °[facility] ° [address], [place] Phone: (336) 832-3150, Fax: (336) 832-3151. Hours of Operation:  8:30 am - 5:30 pm, M-F.  Also accepts Medicaid and self-pay.  °[facility] [place] [address], [place] Phone: (336) 878-6027   °[facility] [address], [place], [place] (336)723-1848, Ext. 123 Mondays & Thursdays: 7-9 AM.  First 15 patients are seen on a first come, first serve basis. °  ° °Medicaid-accepting [place] Providers: ° °Organization         Address  Phone   Notes  °[facility] [address], [place] (336) 641-2100 Also accepts self-pay patients.  °[facility] [address], [place] ° (336) 856-9996   °[facility] [address], [place] (336) 288-8857   °[facility] [address] [place]  [address], [place] (336) 299-7000   °Veita Bland [address], [place]  ° (336) 373-1557 Only accepts [place] Access Medicaid patients after they have their name applied to their card.  ° °Self-Pay (no insurance) in [place]: ° °Organization         Address  Phone   Notes  °Sickle Cell Patients, [facility] [address], [place] (336) 832-1970   °[facility] [address], [place] (336) 832-4400   °[facility] ° [address], [place] (336) 992-4800   °[facility]/Dr. Osei-Bonsu ° [address] [place] [address], [place] or [address], [place] (336) 841-8500 Phone number for both [place] and [place] locations is the same.  °[facility] [address], [place] (336) 299-0000   °[facility] [address] [place] [address], [place] or [address] (336) 852-7530 °(336) 878-2260   °[facility] [address], [place] (336) 350-1642, phone; (336) 294-5005, fax Sees patients 1st and 3rd Saturday of every month.  Must not qualify for public or private insurance (i.e. Medicaid, Medicare, [place] Health Choice, Veterans' Benefits) • Household income should be no more than 200% of the poverty level •The clinic cannot treat you if you are pregnant or think you are pregnant • Sexually transmitted diseases are not treated at the clinic.  ° ° °Dental Care: °Organization         Address  Phone  Notes  °[facility] [facility] [address],  (336) 641-6152 Accepts children up to age 21 who are enrolled in Medicaid or [place] Health Choice; pregnant women with a Medicaid card; and children who have applied for Medicaid or [place] Health Choice, but were declined, whose parents can pay a reduced fee at time of service.  °[facility] [place]  [address], [place]  (336) 641-7733 Accepts children up to age 21 who are enrolled in Medicaid or [place] Health Choice; pregnant women with a Medicaid card; and children who have applied for Medicaid or  Health Choice, but were declined, whose parents can pay a reduced fee at time of service.  °[facility] ° [address],  (336) 641-4533 Patients are seen by appointment only. Walk-ins are not accepted. [facility] will see patients 18 years of age and older. °Monday - Tuesday (  8am-5pm) °Most Wednesdays (8:30-5pm) °$30 per visit, cash only  °[facility] ° [address], [place] (336) 641-4533 Patients are seen by appointment only. Walk-ins are not accepted. [facility] will see patients 18 years of age and older. °One Wednesday Evening (Monthly: Volunteer Based).  $30 per visit, cash only  °[facility]  (919) 537-3737 for adults; Children under age 4, call Graduate Pediatric Dentistry at ([phone]) [phone]. Children aged 4-14, please call ([phone]) [phone] to request a pediatric application. ° Dental services are provided in all areas of dental care including fillings, crowns and bridges, complete and partial dentures, implants, gum treatment, root canals, and extractions. Preventive care is also provided. Treatment is provided to both adults and children. °Patients are selected via a lottery and there is often a waiting list. °  °[facility] [address], °[place] ° (336) 763-8833 [email or web address] °  °[facility] [address], [place], [place] (336)723-1848, Ext. 123 Second and Fourth Thursday of each month, opens at 6:30 AM; Clinic ends at 9 AM.  Patients are seen on a first-come first-served basis, and a limited number are seen during each clinic.  ° °[facility] ° [address], [place], [place] (336) 723-7904   Eligibility Requirements °You must have lived in [place], or [place]  counties for at least the last three months. °  You cannot be eligible for state or federal sponsored healthcare insurance, including Veterans Administration, Medicaid, or Medicare. °  You generally cannot be eligible for healthcare insurance through your employer.  °  How to apply: °Eligibility screenings are held every Tuesday and Wednesday afternoon from 1:00 pm until 4:00 pm. You do not need an appointment for the interview!  °[facility] [address], [place], [place] 336-631-2330   °[facility]  336-342-8273   °[place]  336-703-3100   °[facility]  336-570-6415   ° °Behavioral Health Resources in the Community: °Intensive Outpatient Programs °Organization         Address  Phone  Notes  °[facility] [address], [place], [place] 336-878-6098   °[facility] [address], [place], [place] 336-832-9800   °[facility] [address], [place], [place] ° 336-882-2125   °[facility] [address],  °[place], [place] 1-800-853-5163 or 336-641-4981   °Substance Abuse Resources °Organization         Address  Phone  Notes  °[facility]  336-882-2125   °[facility]  336-784-9470   °[facility]  336-285-9073   °[facility]  336-845-3988   °Residential & Outpatient Substance Abuse Program  1-800-659-3381   °Psychological Services °Organization         Address  Phone  Notes  °[facility]  336- 832-9600   °[facility]  336- 378-7881   °[facility] [address], [place] 1-800-853-5163 or 336-641-4981   ° °Mobile Crisis Teams °Organization         Address  Phone  Notes  °[facility], Mobile Crisis Care Unit  1-877-626-1772   °Assertive °[facility] ° [address] [place], [place] 336-834-9664   °Sharon DeEsch [address] °[place] 336-554-5454   ° °Self-Help/Support Groups °Organization         Address  Phone               Notes  °[facility] [place] - variety of support groups  336- 373-1402 Call for more information  °Narcotics Anonymous (NA), [facility] [address], °[place]  2 meetings at this location  ° °Residential Treatment Programs °Organization         Address  Phone  Notes  °[facility] [address],    °[place]  1-866-801-8205   °[facility] ° [address], [place], [place] 704-293-8524   °[facility] [address], [place] 336-845-3988 Admissions: 8am-3pm M-F  °[facility] [address]    °[place], [place] 336-841-1104   °[facility] [address], [place], [place] 336-379-7146   °[facility] [address]  °[place], [place] 336-285-9073   °[facility] Intensive Outpatient [address], [place], [place] 336-852-3033   °[facility] [address]  °[place], [place] 1-877-615-2722 or 336-784-9470   °[facility] [address] , [place] 336-227-7417 Accepts Medicaid  °[facility] [address]  °[place] 1-800-659-3381 Substance Abuse/Addiction Treatment  ° °[place] Behavioral Health Resources °Organization         Address  Phone  Notes  °[facility]  (888) 581-9988   °Julie Brannon, PhD [address] [place], [place]   (336) 349-5553 or (336) 951-0000   °[facility]   [address] °[place], [place] (336) 349-4454   °[facility] [address], [place], [place] (336) 342-8316 Insurance/Medicaid/sponsorship through [facility]  °[facility] [address]                                    [place], [place] (336) 342-8316 Therapy/tele-psych/case  °[facility] [address]  ° [place],  (336) 349-2233    °Dr. Arfeen  (336)  349-4544   °[facility] [place]  [employer] [place] [facility] 1) [address], [place] °2) [address], [place] °3)  [address], [place] (336) 349-3220 °(336) 342-7768 ° °(336) 342-8140   °[place] [facility] (336) 342-1394 or (336) 342-3537 (After Hours)    ° ° ° ° °

## 2015-07-17 ENCOUNTER — Telehealth (HOSPITAL_COMMUNITY): Payer: Self-pay

## 2015-07-17 LAB — URINE CULTURE

## 2015-07-17 NOTE — ED Notes (Signed)
Positive for chlamydia. Treated per protocol. DHHS form faxed. Attempting to contact pt 

## 2015-07-18 ENCOUNTER — Telehealth: Payer: Self-pay | Admitting: Emergency Medicine

## 2015-07-18 NOTE — Telephone Encounter (Signed)
Post ED Visit - Positive Culture Follow-up  Culture report reviewed by antimicrobial stewardship pharmacist:  Wes Dulaney, Pharm.D., BCPS  Celedonio Miyamoto, Pharm.D., BCPS  Georgina Pillion, Pharm.D., BCPS  [place], [place].D., BCPS, AAHIVP  Estella Husk, Pharm.D., BCPS, AAHIVP  Elder [place], [address].D., BCPS  Positive Urine culture Treated with Doxycycline, organism sensitive to the same and no further patient follow-up is required at this time.  Jiles Harold 07/18/2015, 5:53 PM

## 2015-07-20 LAB — CULTURE, BLOOD (ROUTINE X 2)
Culture: NO GROWTH
Culture: NO GROWTH

## 2015-07-24 ENCOUNTER — Telehealth: Payer: Self-pay | Admitting: Emergency Medicine

## 2015-07-24 NOTE — Telephone Encounter (Signed)
Patient returned call. ID verified x three. Notified of positive Chlamydia and that treatment was given while in ED. STD instructions provided, patient verbalized understanding.

## 2015-10-10 ENCOUNTER — Encounter (HOSPITAL_COMMUNITY): Payer: Self-pay | Admitting: Emergency Medicine

## 2015-10-10 ENCOUNTER — Emergency Department (HOSPITAL_COMMUNITY): Payer: Medicaid Other

## 2015-10-10 ENCOUNTER — Emergency Department (HOSPITAL_COMMUNITY)
Admission: EM | Admit: 2015-10-10 | Discharge: 2015-10-10 | Disposition: A | Discharge status: Home / Self Care | Payer: Medicaid Other | Attending: Emergency Medicine | Admitting: Emergency Medicine

## 2015-10-10 DIAGNOSIS — R55 Syncope and collapse: Secondary | ICD-10-CM | POA: Diagnosis not present

## 2015-10-10 DIAGNOSIS — O9989 Other specified diseases and conditions complicating pregnancy, childbirth and the puerperium: Secondary | ICD-10-CM | POA: Insufficient documentation

## 2015-10-10 DIAGNOSIS — Z79899 Other long term (current) drug therapy: Secondary | ICD-10-CM | POA: Diagnosis not present

## 2015-10-10 DIAGNOSIS — Z8679 Personal history of other diseases of the circulatory system: Secondary | ICD-10-CM | POA: Diagnosis not present

## 2015-10-10 DIAGNOSIS — Z3A01 Less than 8 weeks gestation of pregnancy: Secondary | ICD-10-CM | POA: Diagnosis not present

## 2015-10-10 DIAGNOSIS — Z3490 Encounter for supervision of normal pregnancy, unspecified, unspecified trimester: Secondary | ICD-10-CM

## 2015-10-10 LAB — URINALYSIS, ROUTINE W REFLEX MICROSCOPIC
BILIRUBIN URINE: NEGATIVE
Glucose, UA: NEGATIVE mg/dL
Hgb urine dipstick: NEGATIVE
Ketones, ur: 15 mg/dL — AB
Nitrite: NEGATIVE
PROTEIN: NEGATIVE mg/dL
Specific Gravity, Urine: 1.026 (ref 1.005–1.030)
UROBILINOGEN UA: 0.2 mg/dL (ref 0.0–1.0)
pH: 6 (ref 5.0–8.0)

## 2015-10-10 LAB — CBC
HEMATOCRIT: 35.2 % — AB (ref 36.0–46.0)
Hemoglobin: 11.5 g/dL — ABNORMAL LOW (ref 12.0–15.0)
MCH: 27.3 pg (ref 26.0–34.0)
MCHC: 32.7 g/dL (ref 30.0–36.0)
MCV: 83.6 fL (ref 78.0–100.0)
PLATELETS: 206 10*3/uL (ref 150–400)
RBC: 4.21 MIL/uL (ref 3.87–5.11)
RDW: 14.4 % (ref 11.5–15.5)
WBC: 8.1 10*3/uL (ref 4.0–10.5)

## 2015-10-10 LAB — BASIC METABOLIC PANEL
Anion gap: 9 (ref 5–15)
BUN: 8 mg/dL (ref 6–20)
CO2: 25 mmol/L (ref 22–32)
CREATININE: 0.81 mg/dL (ref 0.44–1.00)
Calcium: 9.7 mg/dL (ref 8.9–10.3)
Chloride: 104 mmol/L (ref 101–111)
GFR calc Af Amer: >60 mL/min (ref >60)
Glucose, Bld: 82 mg/dL (ref 65–99)
POTASSIUM: 3.3 mmol/L — AB (ref 3.5–5.1)
SODIUM: 138 mmol/L (ref 135–145)

## 2015-10-10 LAB — CBG MONITORING, ED
GLUCOSE-CAPILLARY: 78 mg/dL (ref 65–99)
GLUCOSE-CAPILLARY: 86 mg/dL (ref 65–99)

## 2015-10-10 LAB — URINE MICROSCOPIC-ADD ON

## 2015-10-10 LAB — WET PREP, GENITAL
Trich, Wet Prep: NONE SEEN
Yeast Wet Prep HPF POC: NONE SEEN

## 2015-10-10 LAB — I-STAT BETA HCG BLOOD, ED (MC, WL, AP ONLY): I-stat hCG, quantitative: >2000 m[IU]/mL — ABNORMAL HIGH (ref <5)

## 2015-10-10 MED ORDER — POTASSIUM CHLORIDE CRYS ER 20 MEQ PO TBCR
40.0000 meq | EXTENDED_RELEASE_TABLET | Freq: Once | ORAL | Status: AC
  Administered 2015-10-10: 40 meq via ORAL
  Filled 2015-10-10: qty 2

## 2015-10-10 MED ORDER — PRENATAL VITAMIN 27-0.8 MG PO TABS: 1.0000 | ORAL_TABLET | Freq: Every day | ORAL | Status: DC

## 2015-10-10 NOTE — ED Notes (Signed)
Taxi voucher provided to pt per MD request.

## 2015-10-10 NOTE — ED Notes (Signed)
Reviewed discharge instructions and prescription medication with patient, who verbalized understanding. Patient given cab voucher to get home since the last bus has already run and she has no family/friends in the area.

## 2015-10-10 NOTE — ED Notes (Signed)
Pt reports Tuesday she had a syncopal episode after being outside with her kids. Pt c/o pain to L cheek. She is not on blood thinners. Today while at school pt became dizzy and felt like she was going to pass out again. She denies cp/sob/palpitations, fevers/chills. Denies weakness/numbness.

## 2015-10-10 NOTE — Discharge Instructions (Signed)
Ultrasound shows that you're pregnant. Avoid marijuana. Call the women's health clinic tomorrow to schedule an appointment. You should have a repeat ultrasound and hCG test in 14 days. Take the vitamins prescribed as directed

## 2015-10-10 NOTE — ED Provider Notes (Signed)
CSN: [id number]     Arrival date & time 10/10/15  1554 History   First MD Initiated Contact with Patient 10/10/15 1752     Chief Complaint  Patient presents with  . Loss of Consciousness     (Consider location/radiation/quality/duration/timing/severity/associated sxs/prior Treatment) HPI Patient had a syncopal event 2 days ago. She fell, hitting her left cheek. Today she became transiently for 1 or 2 minutes. She did not fall today. She is presently asymptomatic without treatment. She denies having had any headache chest pain shortness of breath or abdominal pain. Last normal menstrual period beginning of September 2016. No abnormal vaginal bleeding. No treatment prior to coming here. Past Medical History  Diagnosis Date  . Migraine headache    History reviewed. No pertinent past surgical history. No family history on file. Social History  Substance Use Topics  . Smoking status: Never Smoker   . Smokeless tobacco: None  . Alcohol Use: No   positive marijuana use OB History    No data available     Review of Systems  Constitutional: Negative.   HENT: Negative.   Respiratory: Negative.   Cardiovascular: Negative.        Syncope  Gastrointestinal: Negative.   Musculoskeletal: Negative.   Skin: Negative.   Neurological: Negative.   Psychiatric/Behavioral: Negative.   All other systems reviewed and are negative.     Allergies  Review of patient's allergies indicates no known allergies.  Home Medications   Prior to Admission medications   Medication Sig Start Date End Date Taking? Authorizing Provider  doxycycline (VIBRAMYCIN) 100 MG capsule Take 1 capsule (100 mg total) by mouth 2 (two) times daily. One po bid x 14 days 07/16/15   Ladona Mow, PA-C  ibuprofen (ADVIL,MOTRIN) 200 MG tablet Take 600 mg by mouth every 6 (six) hours as needed for mild pain or moderate pain.    Historical Provider, MD  ondansetron (ZOFRAN) 4 MG tablet Take 1 tablet (4 mg total) by mouth every  6 (six) hours. 07/16/15   Ladona Mow, PA-C  oxyCODONE-acetaminophen (PERCOCET) 5-325 MG per tablet Take 1-2 tablets by mouth every 6 (six) hours as needed. 07/16/15   Ladona Mow, PA-C   BP 113/70 mmHg  Pulse 108  Temp(Src) 98.7 F (37.1 C) (Oral)  Resp 18  Ht  (1.626 m)  Wt 206 lb 4.8 oz (93.577 kg)  BMI 35.39 kg/m2  SpO2 100%  LMP 08/29/2015 Physical Exam  Constitutional: She appears well-developed and well-nourished.  HENT:  Head: Normocephalic and atraumatic.  Eyes: Conjunctivae are normal. Pupils are equal, round, and reactive to light.  Neck: Neck supple. No tracheal deviation present. No thyromegaly present.  Cardiovascular: Normal rate and regular rhythm.   No murmur heard. Pulmonary/Chest: Effort normal and breath sounds normal.  Abdominal: Soft. Bowel sounds are normal. She exhibits no distension. There is no tenderness.  OBese  Genitourinary:  No external lesion. Cervical os closed. Slight amount of whitish vaginal discharge. No cervical motion tenderness cervical os closed. No blood in vault. No adnexal masses or tenderness  Musculoskeletal: Normal range of motion. She exhibits no edema or tenderness.  Neurological: She is alert. Coordination normal.  Gait normal as are x-rays well cranial nerves II through XII intact not lightheaded on standing  Skin: Skin is warm and dry. No rash noted.  Psychiatric: She has a normal mood and affect.  Nursing note and vitals reviewed.   ED Course  Procedures (including critical care time) Labs Review Labs Reviewed  BASIC METABOLIC PANEL - Abnormal; Notable for the following:    Potassium 3.3 (*)    All other components within normal limits  CBC - Abnormal; Notable for the following:    Hemoglobin 11.5 (*)    HCT 35.2 (*)    All other components within normal limits  I-STAT BETA HCG BLOOD, ED ([facility], [facility], [facility] ONLY) - Abnormal; Notable for the following:    I-stat hCG, quantitative >2000.0 (*)    All other components within  normal limits  URINALYSIS, ROUTINE W REFLEX MICROSCOPIC (NOT AT [facility])  CBG MONITORING, ED    Imaging Review No results found. I have personally reviewed and evaluated these images and lab results as part of my medical decision-making.   EKG Interpretation   Date/Time:  Thursday October 10 2015 15:58:06 EDT Ventricular Rate:  105 PR Interval:  142 QRS Duration: 82 QT Interval:  342 QTC Calculation: 452 R Axis:   40 Text Interpretation:  Sinus tachycardia Nonspecific T wave abnormality  Abnormal ECG No old tracing to compare Confirmed by Sontee Desena  MD, Aliany Fiorenza  [phone]) on 10/10/2015 6:31:50 PM     11:20 PM patient is asymptomatic no distress. Results for orders placed or performed during the hospital encounter of 10/10/15  Wet prep, genital  Result Value Ref Range   Yeast Wet Prep HPF POC NONE SEEN NONE SEEN   Trich, Wet Prep NONE SEEN NONE SEEN   Clue Cells Wet Prep HPF POC FEW (A) NONE SEEN   WBC, Wet Prep HPF POC FEW (A) NONE SEEN  Basic metabolic panel  Result Value Ref Range   Sodium 138 135 - 145 mmol/L   Potassium 3.3 (L) 3.5 - 5.1 mmol/L   Chloride 104 101 - 111 mmol/L   CO2 25 22 - 32 mmol/L   Glucose, Bld 82 65 - 99 mg/dL   BUN 8 6 - 20 mg/dL   Creatinine, Ser 3.24 0.44 - 1.00 mg/dL   Calcium 9.7 8.9 - 40.1 mg/dL   GFR calc non Af Amer >60 >60 mL/min   GFR calc Af Amer >60 >60 mL/min   Anion gap 9 5 - 15  CBC  Result Value Ref Range   WBC 8.1 4.0 - 10.5 K/uL   RBC 4.21 3.87 - 5.11 MIL/uL   Hemoglobin 11.5 (L) 12.0 - 15.0 g/dL   HCT 02.7 (L) 25.3 - 66.4 %   MCV 83.6 78.0 - 100.0 fL   MCH 27.3 26.0 - 34.0 pg   MCHC 32.7 30.0 - 36.0 g/dL   RDW 40.3 47.4 - 25.9 %   Platelets 206 150 - 400 K/uL  Urinalysis, Routine w reflex microscopic (not at [facility])  Result Value Ref Range   Color, Urine AMBER (A) YELLOW   APPearance CLOUDY (A) CLEAR   Specific Gravity, Urine 1.026 1.005 - 1.030   pH 6.0 5.0 - 8.0   Glucose, UA NEGATIVE NEGATIVE mg/dL   Hgb urine  dipstick NEGATIVE NEGATIVE   Bilirubin Urine NEGATIVE NEGATIVE   Ketones, ur 15 (A) NEGATIVE mg/dL   Protein, ur NEGATIVE NEGATIVE mg/dL   Urobilinogen, UA 0.2 0.0 - 1.0 mg/dL   Nitrite NEGATIVE NEGATIVE   Leukocytes, UA SMALL (A) NEGATIVE  Urine microscopic-add on  Result Value Ref Range   Squamous Epithelial / LPF FEW (A) RARE   WBC, UA 3-6 <3 WBC/hpf   Bacteria, UA MANY (A) RARE   Urine-Other MUCOUS PRESENT   CBG monitoring, ED  Result Value Ref Range   Glucose-Capillary  78 65 - 99 mg/dL  I-Stat beta hCG blood, ED ([facility], [facility], [facility] only)  Result Value Ref Range   I-stat hCG, quantitative >2000.0 (H) <5 mIU/mL   Comment 3          CBG monitoring, ED  Result Value Ref Range   Glucose-Capillary 86 65 - 99 mg/dL   US Ob Comp Less 14 Wks  10/10/2015  CLINICAL DATA:  Pregnant. Syncope. Patient became dizzy around 2 p.m. today. Estimated gestational age by LMP is 6 weeks 2 days. Quantitative beta HCG is greater than 2000. EXAM: OBSTETRIC <14 WK [place] AND TRANSVAGINAL OB US TECHNIQUE: Both transabdominal and transvaginal ultrasound examinations were performed for complete evaluation of the gestation as well as the maternal uterus, adnexal regions, and pelvic cul-de-sac. Transvaginal technique was performed to assess early pregnancy. COMPARISON:  None. FINDINGS: Intrauterine gestational sac: A single intrauterine gestational sac is present. Yolk sac:  Yolk sac is present. Embryo:  Fetal pole is not identified. Cardiac Activity: Not identified. MSD: 19  mm   6 w   6  d                [place] EDC: 05/29/2016 Maternal uterus/adnexae: Uterus is retroverted. No myometrial mass lesions identified. No evidence of subchorionic hemorrhage. Limited visualization of the cervix. Ovaries are not visualized. No abnormal adnexal masses demonstrated on images of the adnexal regions obtained. No free fluid in the pelvis. IMPRESSION: Probable early intrauterine gestational sac with yolk sac, but no fetal pole or cardiac  activity yet visualized. Recommend follow-up quantitative B-HCG levels and follow-up US in 14 days to confirm and assess viability. This recommendation follows SRU consensus guidelines: Diagnostic Criteria for Nonviable Pregnancy Early in the First Trimester. Malva Limes Med 2013; 409:8119-14. Electronically Signed   By: Burman Nieves M.D.   On: 10/10/2015 21:21   US Ob Transvaginal  10/10/2015  CLINICAL DATA:  Pregnant. Syncope. Patient became dizzy around 2 p.m. today. Estimated gestational age by LMP is 6 weeks 2 days. Quantitative beta HCG is greater than 2000. EXAM: OBSTETRIC <14 WK [place] AND TRANSVAGINAL OB US TECHNIQUE: Both transabdominal and transvaginal ultrasound examinations were performed for complete evaluation of the gestation as well as the maternal uterus, adnexal regions, and pelvic cul-de-sac. Transvaginal technique was performed to assess early pregnancy. COMPARISON:  None. FINDINGS: Intrauterine gestational sac: A single intrauterine gestational sac is present. Yolk sac:  Yolk sac is present. Embryo:  Fetal pole is not identified. Cardiac Activity: Not identified. MSD: 19  mm   6 w   6  d                [place] EDC: 05/29/2016 Maternal uterus/adnexae: Uterus is retroverted. No myometrial mass lesions identified. No evidence of subchorionic hemorrhage. Limited visualization of the cervix. Ovaries are not visualized. No abnormal adnexal masses demonstrated on images of the adnexal regions obtained. No free fluid in the pelvis. IMPRESSION: Probable early intrauterine gestational sac with yolk sac, but no fetal pole or cardiac activity yet visualized. Recommend follow-up quantitative B-HCG levels and follow-up US in 14 days to confirm and assess viability. This recommendation follows SRU consensus guidelines: Diagnostic Criteria for Nonviable Pregnancy Early in the First Trimester. Malva Limes Med 2013; 782:9562-13. Electronically Signed   By: Burman Nieves M.D.   On: 10/10/2015 21:21    MDM   Strongly doubt pulmonary embolism. No shortness of breath no chest pain. Doubt subarachnoid hemorrhage as cause of syncope. She patient has no  headache. She is comfortable. Will check pelvic ultrasound as patient is newly pregnant and was unaware pregnancy Final diagnoses:  None   etiology of syncope is unclear. Pretest clinical Suspicion for ectopic pregnancy is low  Plan referral [facility] for prenatal care. Patient advised to avoid marijuana. Diagnosis #1 syncope #2 pregnancy #3 substance abuse #4 hypokalemia     Doug SouSam Zyniah Ferraiolo, MD 10/10/15 [phone]

## 2015-10-11 LAB — HIV ANTIBODY (ROUTINE TESTING W REFLEX): HIV Screen 4th Generation wRfx: NONREACTIVE

## 2015-10-11 LAB — GC/CHLAMYDIA PROBE AMP (~~LOC~~) NOT AT ARMC
CHLAMYDIA, DNA PROBE: NEGATIVE
Neisseria Gonorrhea: NEGATIVE

## 2015-10-11 LAB — RPR: RPR Ser Ql: NONREACTIVE

## 2015-11-20 ENCOUNTER — Encounter: Payer: Self-pay | Admitting: Advanced Practice Midwife

## 2015-11-20 ENCOUNTER — Encounter: Payer: Medicaid Other | Admitting: Advanced Practice Midwife

## 2015-12-11 ENCOUNTER — Other Ambulatory Visit (HOSPITAL_COMMUNITY)
Admission: RE | Admit: 2015-12-11 | Discharge: 2015-12-11 | Disposition: A | Discharge status: Home / Self Care | Payer: Medicaid Other | Source: Ambulatory Visit | Attending: Advanced Practice Midwife | Admitting: Advanced Practice Midwife

## 2015-12-11 ENCOUNTER — Ambulatory Visit (INDEPENDENT_AMBULATORY_CARE_PROVIDER_SITE_OTHER): Payer: Medicaid Other | Admitting: Advanced Practice Midwife

## 2015-12-11 ENCOUNTER — Encounter: Payer: Self-pay | Admitting: Advanced Practice Midwife

## 2015-12-11 VITALS — BP 120/65 | HR 81 | Temp 98.2°F | Wt 203.3 lb

## 2015-12-11 DIAGNOSIS — Z3482 Encounter for supervision of other normal pregnancy, second trimester: Secondary | ICD-10-CM

## 2015-12-11 DIAGNOSIS — Z3492 Encounter for supervision of normal pregnancy, unspecified, second trimester: Secondary | ICD-10-CM

## 2015-12-11 DIAGNOSIS — Z01419 Encounter for gynecological examination (general) (routine) without abnormal findings: Secondary | ICD-10-CM | POA: Diagnosis present

## 2015-12-11 DIAGNOSIS — O3680X0 Pregnancy with inconclusive fetal viability, not applicable or unspecified: Secondary | ICD-10-CM | POA: Diagnosis not present

## 2015-12-11 DIAGNOSIS — O3680X1 Pregnancy with inconclusive fetal viability, fetus 1: Secondary | ICD-10-CM

## 2015-12-11 DIAGNOSIS — Z113 Encounter for screening for infections with a predominantly sexual mode of transmission: Secondary | ICD-10-CM | POA: Insufficient documentation

## 2015-12-11 DIAGNOSIS — Z3490 Encounter for supervision of normal pregnancy, unspecified, unspecified trimester: Secondary | ICD-10-CM | POA: Insufficient documentation

## 2015-12-11 LAB — POCT URINALYSIS DIP (DEVICE)
BILIRUBIN URINE: NEGATIVE
GLUCOSE, UA: NEGATIVE mg/dL
Hgb urine dipstick: NEGATIVE
LEUKOCYTES UA: NEGATIVE
NITRITE: NEGATIVE
Protein, ur: NEGATIVE mg/dL
Specific Gravity, Urine: 1.015 (ref 1.005–1.030)
UROBILINOGEN UA: 0.2 mg/dL (ref 0.0–1.0)
pH: 7.5 (ref 5.0–8.0)

## 2015-12-11 LAB — OB RESULTS CONSOLE GC/CHLAMYDIA: Gonorrhea: NEGATIVE

## 2015-12-11 MED ORDER — CONCEPT OB 130-92.4-1 MG PO CAPS: 1.0000 | ORAL_CAPSULE | Freq: Every day | ORAL | Status: DC

## 2015-12-11 NOTE — Addendum Note (Signed)
Addended by: Jill SideAY, DIANE L on: 12/11/2015 12:14 PM   Modules accepted: Orders

## 2015-12-11 NOTE — Progress Notes (Signed)
   Subjective:    Heather Kim is a [id number] 2945w6d by LMP being seen today for her first obstetrical visit.  Her obstetrical history is significant for nothing. Patient does intend to breast feed. Pregnancy history fully reviewed.  Patient reports no complaints. Had N/V w/ PNV that resolved when she stopped.   Filed Vitals:   12/11/15 0909  BP: 120/65  Pulse: 81  Temp: 98.2 F (36.8 C)  Weight: 203 lb 4.8 oz (92.216 kg)    HISTORY: OB History  Gravida Para Term Preterm AB SAB TAB Ectopic Multiple Living  4 2 2  1 1    2     # Outcome Date GA Lbr Len/2nd Weight Sex Delivery Anes PTL Lv  4 Current           3 Term           2 Term           1 SAB              Past Medical History  Diagnosis Date  . Migraine headache    History reviewed. No pertinent past surgical history. Family History  Problem Relation Age of Onset  . Deep vein thrombosis Mother   . Cancer Father      Exam    Uterus:   15 week size  Pelvic Exam:    Perineum: No Hemorrhoids   Vulva: normal, Bartholin's, Urethra, Skene's normal   Vagina:  normal mucosa, normal discharge, spotting after Pap   pH: NA   Cervix: multiparous appearance, no cervical motion tenderness and Spotting after pap   Adnexa: normal adnexa   Bony Pelvis: proven  System: Breast:  declined   Skin: normal coloration and turgor, no rashes    Neurologic: oriented, normal mood, grossly non-focal   Extremities: normal strength, tone, and muscle mass   HEENT sclera clear, anicteric   Mouth/Teeth mucous membranes moist, pharynx normal without lesions and dental hygiene good   Neck supple and no masses   Cardiovascular: regular rate and rhythm   Respiratory:  appears well, vitals normal, no respiratory distress, acyanotic, normal RR   Abdomen: soft, non-tender; bowel sounds normal; no masses,  no organomegaly   Urinary: urethral meatus normal      Assessment:    Pregnancy: [id number] There are no active problems to display  for this patient.       Plan:     Initial labs drawn. Prenatal vitamins. Problem list reviewed and updated. Genetic Screening discussed Quad Screen: ordered.  Ultrasound discussed; fetal survey: ordered.  Follow up in 4 weeks.    Dorathy KinsmanSMITH, Naeema Patlan 12/11/2015

## 2015-12-11 NOTE — Progress Notes (Signed)
Bedside US for viability performed.  Single IUP w/FHR - 148 bpm per PW doppler;  CRL - 9.58 cm (15w 3d)  Fetal activity present.

## 2015-12-11 NOTE — Patient Instructions (Signed)

## 2015-12-11 NOTE — Progress Notes (Signed)
Breastfeeding tip of the week reviewed. 

## 2015-12-12 LAB — AFP, QUAD SCREEN
AFP: 23.2 ng/mL
Age Alone: 1:715 {titer}
CURR GEST AGE: 15.3 wks.days
Down Syndrome Scr Risk Est: 1:10200 {titer}
HCG TOTAL: 22.09 [IU]/mL
INH: 127.9 pg/mL
Interpretation-AFP: NEGATIVE
MOM FOR INH: 0.81
MoM for AFP: 0.79
MoM for hCG: 0.55
Open Spina bifida: NEGATIVE
Osb Risk: <1:54600 {titer}
Tri 18 Scr Risk Est: NEGATIVE
Trisomy 18 (Edward) Syndrome Interp.: 1:4250 {titer}
UE3 MOM: 0.72
uE3 Value: 0.48 ng/mL

## 2015-12-12 LAB — PRENATAL PROFILE (SOLSTAS)
Antibody Screen: NEGATIVE
BASOS PCT: 0 % (ref 0–1)
Basophils Absolute: 0 10*3/uL (ref 0.0–0.1)
EOS ABS: 0.1 10*3/uL (ref 0.0–0.7)
Eosinophils Relative: 1 % (ref 0–5)
HCT: 34.7 % — ABNORMAL LOW (ref 36.0–46.0)
HEMOGLOBIN: 11.9 g/dL — AB (ref 12.0–15.0)
HEP B S AG: NEGATIVE
HIV: NONREACTIVE
Lymphocytes Relative: 19 % (ref 12–46)
Lymphs Abs: 1.4 10*3/uL (ref 0.7–4.0)
MCH: 28 pg (ref 26.0–34.0)
MCHC: 34.3 g/dL (ref 30.0–36.0)
MCV: 81.6 fL (ref 78.0–100.0)
MONOS PCT: 5 % (ref 3–12)
MPV: 10.6 fL (ref 8.6–12.4)
Monocytes Absolute: 0.4 10*3/uL (ref 0.1–1.0)
NEUTROS ABS: 5.6 10*3/uL (ref 1.7–7.7)
NEUTROS PCT: 75 % (ref 43–77)
Platelets: 211 10*3/uL (ref 150–400)
RBC: 4.25 MIL/uL (ref 3.87–5.11)
RDW: 15.3 % (ref 11.5–15.5)
RUBELLA: 1.49 {index} — AB (ref <0.90)
Rh Type: POSITIVE
WBC: 7.5 10*3/uL (ref 4.0–10.5)

## 2015-12-12 LAB — GC/CHLAMYDIA PROBE AMP (~~LOC~~) NOT AT ARMC
CHLAMYDIA, DNA PROBE: NEGATIVE
Neisseria Gonorrhea: NEGATIVE

## 2015-12-13 LAB — CYTOLOGY - PAP

## 2015-12-13 LAB — HEMOGLOBINOPATHY EVALUATION
HGB A2 QUANT: 2.5 % (ref 2.2–3.2)
HGB A: 97.5 % (ref 96.8–97.8)
Hemoglobin Other: 0 %
Hgb F Quant: 0 % (ref 0.0–2.0)
Hgb S Quant: 0 %

## 2015-12-13 LAB — CULTURE, OB URINE: Colony Count: 25000

## 2015-12-15 LAB — CANNABANOIDS (GC/LC/MS), URINE: THC-COOH UR CONFIRM: 244 ng/mL — AB (ref <5)

## 2015-12-17 LAB — PRESCRIPTION MONITORING PROFILE (19 PANEL)
AMPHETAMINE/METH: NEGATIVE ng/mL
BUPRENORPHINE, URINE: NEGATIVE ng/mL
Barbiturate Screen, Urine: NEGATIVE ng/mL
Benzodiazepine Screen, Urine: NEGATIVE ng/mL
CARISOPRODOL, URINE: NEGATIVE ng/mL
COCAINE METABOLITES: NEGATIVE ng/mL
Creatinine, Urine: 148.9 mg/dL (ref >20.0)
Fentanyl, Ur: NEGATIVE ng/mL
MDMA URINE: NEGATIVE ng/mL
MEPERIDINE UR: NEGATIVE ng/mL
METHADONE SCREEN, URINE: NEGATIVE ng/mL
Methaqualone: NEGATIVE ng/mL
Nitrites, Initial: NEGATIVE ug/mL
OXYCODONE SCRN UR: NEGATIVE ng/mL
Opiate Screen, Urine: NEGATIVE ng/mL
PH URINE, INITIAL: 7.5 pH (ref 4.5–8.9)
PHENCYCLIDINE, UR: NEGATIVE ng/mL
Propoxyphene: NEGATIVE ng/mL
TAPENTADOLUR: NEGATIVE ng/mL
Tramadol Scrn, Ur: NEGATIVE ng/mL
Zolpidem, Urine: NEGATIVE ng/mL

## 2015-12-18 ENCOUNTER — Encounter: Payer: Self-pay | Admitting: Family Medicine

## 2015-12-21 ENCOUNTER — Encounter: Payer: Self-pay | Admitting: Advanced Practice Midwife

## 2015-12-21 DIAGNOSIS — F1290 Cannabis use, unspecified, uncomplicated: Secondary | ICD-10-CM | POA: Insufficient documentation

## 2015-12-29 NOTE — L&D Delivery Note (Signed)
Patient is 30 y.o. [id number] 5049w3d admitted in active labor   Delivery Note At 3:56 AM a viable female was delivered via Vaginal, Spontaneous Delivery (Presentation: LOA to ROA).  APGAR:8, 9; weight pending.   Placenta status: Intact, Spontaneous.  Cord: 3 vessels,  with the following complications:none.  Cord pH: n/a  Anesthesia:   Episiotomy: None Lacerations: 1st degree;Periurethral;Perineal Suture Repair: 3.0 vicryl Est. Blood Loss (mL): 150  Mom to postpartum.  Baby to Couplet care / Skin to Skin.  Heather Kim 05/31/2016, 4:26 AM   OB fellow attestation: Patient is a [id number] at 2349w3d who was admitted in active labor. I was gloved and present for delivery in its entirety.  Second stage of labor progressed    Complications: none  Lacerations: 1st degree perineal - hemostatic and not repaired. Right Periurethral laceration- repaired in a  running fashion using 3.0 Vicryl   EBL: 150  Federico FlakeKimberly Kim [name], MD 4:30 AM

## 2016-01-08 ENCOUNTER — Ambulatory Visit (INDEPENDENT_AMBULATORY_CARE_PROVIDER_SITE_OTHER): Payer: Medicaid Other | Admitting: Certified Nurse Midwife

## 2016-01-08 VITALS — BP 117/62 | HR 83 | Temp 98.4°F | Wt 197.4 lb

## 2016-01-08 DIAGNOSIS — O219 Vomiting of pregnancy, unspecified: Secondary | ICD-10-CM

## 2016-01-08 DIAGNOSIS — Z3492 Encounter for supervision of normal pregnancy, unspecified, second trimester: Secondary | ICD-10-CM

## 2016-01-08 LAB — POCT URINALYSIS DIP (DEVICE)
Glucose, UA: NEGATIVE mg/dL
Ketones, ur: 40 mg/dL — AB
Nitrite: NEGATIVE
Protein, ur: 30 mg/dL — AB
Specific Gravity, Urine: 1.02 (ref 1.005–1.030)
Urobilinogen, UA: 1 mg/dL (ref 0.0–1.0)
pH: 6.5 (ref 5.0–8.0)

## 2016-01-08 MED ORDER — PROMETHAZINE HCL 25 MG PO TABS: 25.0000 mg | ORAL_TABLET | Freq: Four times a day (QID) | ORAL | Status: DC | PRN

## 2016-01-08 NOTE — Progress Notes (Signed)
Subjective:  Heather Kim is a 30 y.o. [id number] at 3330w6d being seen today for ongoing prenatal care.  She is currently monitored for the following issues for this low-risk pregnancy and has Supervision of normal pregnancy and Marijuana use on her problem list.  Patient reports headache.  Contractions: Not present. Vag. Bleeding: None.  Movement: Present. Denies leaking of fluid.   The following portions of the patient's history were reviewed and updated as appropriate: allergies, current medications, past family history, past medical history, past social history, past surgical history and problem list. Problem list updated.  Objective:   Filed Vitals:   01/08/16 1027  BP: 117/62  Pulse: 83  Temp: 98.4 F (36.9 C)  Weight: 197 lb 6.4 oz (89.54 kg)    Fetal Status: Fetal Heart Rate (bpm): 148   Movement: Present     General:  Alert, oriented and cooperative. Patient is in no acute distress.  Skin: Skin is warm and dry. No rash noted.   Cardiovascular: Normal heart rate noted  Respiratory: Normal respiratory effort, no problems with respiration noted  Abdomen: Soft, gravid, appropriate for gestational age. Pain/Pressure: Present     Pelvic: Vag. Bleeding: None     Cervical exam deferred        Extremities: Normal range of motion.  Edema: None  Mental Status: Normal mood and affect. Normal behavior. Normal judgment and thought content.   Urinalysis:      Assessment and Plan:  Pregnancy: [name] at 6830w6d  1. Supervision of normal pregnancy, second trimester Anatomy U/S scheduled for 01/13/16  - promethazine (PHENERGAN) 25 MG tablet; Take 1 tablet (25 mg total) by mouth every 6 (six) hours as needed for nausea or vomiting.  Dispense: 30 tablet; Refill: 1  2. Nausea and vomiting during pregnancy  - promethazine (PHENERGAN) 25 MG tablet; Take 1 tablet (25 mg total) by mouth every 6 (six) hours as needed for nausea or vomiting.  Dispense: 30 tablet; Refill: 1  Preterm labor  symptoms and general obstetric precautions including but not limited to vaginal bleeding, contractions, leaking of fluid and fetal movement were reviewed in detail with the patient. Please refer to After Visit Summary for other counseling recommendations.  No Follow-up on file.   Rhea PinkLori A Thomas Rhude, CNM

## 2016-01-08 NOTE — Progress Notes (Signed)
C/o a lot of headaches over the past 4 weeks and they usually 3 days. States tries taking tylenol but usually throws it back up.

## 2016-01-13 ENCOUNTER — Other Ambulatory Visit: Payer: Self-pay | Admitting: Advanced Practice Midwife

## 2016-01-13 ENCOUNTER — Ambulatory Visit (HOSPITAL_COMMUNITY)
Admission: RE | Admit: 2016-01-13 | Discharge: 2016-01-13 | Disposition: A | Discharge status: Home / Self Care | Payer: Medicaid Other | Source: Ambulatory Visit | Attending: Advanced Practice Midwife | Admitting: Advanced Practice Midwife

## 2016-01-13 DIAGNOSIS — F1210 Cannabis abuse, uncomplicated: Secondary | ICD-10-CM

## 2016-01-13 DIAGNOSIS — Z3A19 19 weeks gestation of pregnancy: Secondary | ICD-10-CM

## 2016-01-13 DIAGNOSIS — Z1389 Encounter for screening for other disorder: Secondary | ICD-10-CM

## 2016-01-13 DIAGNOSIS — Z3492 Encounter for supervision of normal pregnancy, unspecified, second trimester: Secondary | ICD-10-CM

## 2016-01-13 DIAGNOSIS — Z36 Encounter for antenatal screening of mother: Secondary | ICD-10-CM | POA: Insufficient documentation

## 2016-02-05 ENCOUNTER — Encounter: Payer: Medicaid Other | Admitting: Family

## 2016-02-12 ENCOUNTER — Ambulatory Visit (INDEPENDENT_AMBULATORY_CARE_PROVIDER_SITE_OTHER): Payer: Medicaid Other | Admitting: Advanced Practice Midwife

## 2016-02-12 VITALS — BP 107/89 | HR 78 | Temp 98.3°F | Wt 206.4 lb

## 2016-02-12 DIAGNOSIS — Z3482 Encounter for supervision of other normal pregnancy, second trimester: Secondary | ICD-10-CM

## 2016-02-12 DIAGNOSIS — O26899 Other specified pregnancy related conditions, unspecified trimester: Secondary | ICD-10-CM

## 2016-02-12 DIAGNOSIS — F1290 Cannabis use, unspecified, uncomplicated: Secondary | ICD-10-CM

## 2016-02-12 DIAGNOSIS — O99322 Drug use complicating pregnancy, second trimester: Secondary | ICD-10-CM

## 2016-02-12 DIAGNOSIS — R109 Unspecified abdominal pain: Secondary | ICD-10-CM

## 2016-02-12 DIAGNOSIS — O9989 Other specified diseases and conditions complicating pregnancy, childbirth and the puerperium: Secondary | ICD-10-CM

## 2016-02-12 NOTE — Progress Notes (Signed)
Subjective:  Heather Kim is a 30 y.o. [phone] at [redacted]w[redacted]d being seen today for ongoing prenatal care.  She is currently monitored for the following issues for this low-risk pregnancy and has Supervision of normal pregnancy and Marijuana use on her problem list.  Patient reports cramping contractions 3x/hour for last 2-3 days, made worse by walking or standing too long, improved with rest.  Contractions: Irregular. Vag. Bleeding: None.  Movement: Present. Denies leaking of fluid.   The following portions of the patient's history were reviewed and updated as appropriate: allergies, current medications, past family history, past medical history, past social history, past surgical history and problem list. Problem list updated.  Objective:   Filed Vitals:   02/12/16 1118  BP: 107/89  Pulse: 78  Temp: 98.3 F (36.8 C)  Weight: 206 lb 6.4 oz (93.622 kg)    Fetal Status: Fetal Heart Rate (bpm): 148   Movement: Present     General:  Alert, oriented and cooperative. Patient is in no acute distress.  Skin: Skin is warm and dry. No rash noted.   Cardiovascular: Normal heart rate noted  Respiratory: Normal respiratory effort, no problems with respiration noted  Abdomen: Soft, gravid, appropriate for gestational age. Pain/Pressure: Present     Pelvic: Vag. Bleeding: None     Cervical exam performed Dilation: Closed Effacement (%): Thick Station: Ballotable  Extremities: Normal range of motion.  Edema: None  Mental Status: Normal mood and affect. Normal behavior. Normal judgment and thought content.   Urinalysis:      Assessment and Plan:  Pregnancy: [id number] at [redacted]w[redacted]d  1. Abdominal pain affecting pregnancy - Culture, OB Urine  2. Abdominal pain affecting pregnancy, antepartum --FFN collected but not sent, cervix 0/thick/high.  Pt unable to urinate until end of visit, recommend increased PO fluids, rest/ice/heat/warm bath/Tylenol for pain.   --Urine culture ordered  Preterm labor symptoms  and general obstetric precautions including but not limited to vaginal bleeding, contractions, leaking of fluid and fetal movement were reviewed in detail with the patient. Please refer to After Visit Summary for other counseling recommendations.  Return in about 4 weeks (around 03/11/2016) for LOB & 1hr GTT.   Hurshel Party, CNM

## 2016-02-12 NOTE — Patient Instructions (Signed)
Dehydration, Adult Dehydration is a condition in which you do not have enough fluid or water in your body. It happens when you take in less fluid than you lose. Vital organs such as the kidneys, brain, and heart cannot function without a proper amount of fluids. Any loss of fluids from the body can cause dehydration.  Dehydration can range from mild to severe. This condition should be treated right away to help prevent it from becoming severe. CAUSES  This condition may be caused by:  Vomiting.  Diarrhea.  Excessive sweating, such as when exercising in hot or humid weather.  Not drinking enough fluid during strenuous exercise or during an illness.  Excessive urine output.  Fever.  Certain medicines. RISK FACTORS This condition is more likely to develop in:  People who are taking certain medicines that cause the body to lose excess fluid (diuretics).   People who have a chronic illness, such as diabetes, that may increase urination.  Older adults.   People who live at high altitudes.   People who participate in endurance sports.  SYMPTOMS  Mild Dehydration  Thirst.  Dry lips.  Slightly dry mouth.  Dry, warm skin. Moderate Dehydration  Very dry mouth.   Muscle cramps.   Dark urine and decreased urine production.   Decreased tear production.   Headache.   Light-headedness, especially when you stand up from a sitting position.  Severe Dehydration  Changes in skin.   Cold and clammy skin.   Skin does not spring back quickly when lightly pinched and released.   Changes in body fluids.   Extreme thirst.   No tears.   Not able to sweat when body temperature is high, such as in hot weather.   Minimal urine production.   Changes in vital signs.   Rapid, weak pulse (more than 100 beats per minute when you are sitting still).   Rapid breathing.   Low blood pressure.   Other changes.   Sunken eyes.   Cold hands and feet.    Confusion.  Lethargy and difficulty being awakened.  Fainting (syncope).   Short-term weight loss.   Unconsciousness. DIAGNOSIS  This condition may be diagnosed based on your symptoms. You may also have tests to determine how severe your dehydration is. These tests may include:   Urine tests.   Blood tests.  TREATMENT  Treatment for this condition depends on the severity. Mild or moderate dehydration can often be treated at home. Treatment should be started right away. Do not wait until dehydration becomes severe. Severe dehydration needs to be treated at the hospital. Treatment for Mild Dehydration  Drinking plenty of water to replace the fluid you have lost.   Replacing minerals in your blood (electrolytes) that you may have lost.  Treatment for Moderate Dehydration  Consuming oral rehydration solution (ORS). Treatment for Severe Dehydration  Receiving fluid through an IV tube.   Receiving electrolyte solution through a feeding tube that is passed through your nose and into your stomach (nasogastric tube or NG tube).  Correcting any abnormalities in electrolytes. HOME CARE INSTRUCTIONS   Drink enough fluid to keep your urine clear or pale yellow.   Drink water or fluid slowly by taking small sips. You can also try sucking on ice cubes.  Have food or beverages that contain electrolytes. Examples include bananas and sports drinks.  Take over-the-counter and prescription medicines only as told by your health care provider.   Prepare ORS according to the manufacturer's instructions. Take sips   of ORS every 5 minutes until your urine returns to normal.  If you have vomiting or diarrhea, continue to try to drink water, ORS, or both.   If you have diarrhea, avoid:   Beverages that contain caffeine.   Fruit juice.   Milk.   Carbonated soft drinks.  Do not take salt tablets. This can lead to the condition of having too much sodium in your body  (hypernatremia).  SEEK MEDICAL CARE IF:  You cannot eat or drink without vomiting.  You have had moderate diarrhea during a period of more than 24 hours.  You have a fever. SEEK IMMEDIATE MEDICAL CARE IF:   You have extreme thirst.  You have severe diarrhea.  You have not urinated in 6-8 hours, or you have urinated only a small amount of very dark urine.  You have shriveled skin.  You are dizzy, confused, or both.   This information is not intended to replace advice given to you by your health care provider. Make sure you discuss any questions you have with your health care provider.   Document Released: 12/14/2005 Document Revised: 09/04/2015 Document Reviewed: 05/01/2015 Elsevier Interactive Patient Education 2016 [employer] Abdominal Pain During Pregnancy Abdominal pain is common in pregnancy. Most of the time, it does not cause harm. There are many causes of abdominal pain. Some causes are more serious than others. Some of the causes of abdominal pain in pregnancy are easily diagnosed. Occasionally, the diagnosis takes time to understand. Other times, the cause is not determined. Abdominal pain can be a sign that something is very wrong with the pregnancy, or the pain may have nothing to do with the pregnancy at all. For this reason, always tell your health care provider if you have any abdominal discomfort. HOME CARE INSTRUCTIONS  Monitor your abdominal pain for any changes. The following actions may help to alleviate any discomfort you are experiencing:  Do not have sexual intercourse or put anything in your vagina until your symptoms go away completely.  Get plenty of rest until your pain improves.  Drink clear fluids if you feel nauseous. Avoid solid food as long as you are uncomfortable or nauseous.  Only take over-the-counter or prescription medicine as directed by your health care provider.  Keep all follow-up appointments with your health care provider. SEEK  IMMEDIATE MEDICAL CARE IF:  You are bleeding, leaking fluid, or passing tissue from the vagina.  You have increasing pain or cramping.  You have persistent vomiting.  You have painful or bloody urination.  You have a fever.  You notice a decrease in your baby's movements.  You have extreme weakness or feel faint.  You have shortness of breath, with or without abdominal pain.  You develop a severe headache with abdominal pain.  You have abnormal vaginal discharge with abdominal pain.  You have persistent diarrhea.  You have abdominal pain that continues even after rest, or gets worse. MAKE SURE YOU:   Understand these instructions.  Will watch your condition.  Will get help right away if you are not doing well or get worse.   This information is not intended to replace advice given to you by your health care provider. Make sure you discuss any questions you have with your health care provider.   Document Released: 12/14/2005 Document Revised: 10/04/2013 Document Reviewed: 07/13/2013 Elsevier Interactive Patient Education [employer]

## 2016-02-13 LAB — CULTURE, OB URINE

## 2016-03-10 ENCOUNTER — Encounter: Payer: Medicaid Other | Admitting: Advanced Practice Midwife

## 2016-03-17 ENCOUNTER — Ambulatory Visit (INDEPENDENT_AMBULATORY_CARE_PROVIDER_SITE_OTHER): Payer: Medicaid Other | Admitting: Obstetrics and Gynecology

## 2016-03-17 ENCOUNTER — Encounter: Payer: Self-pay | Admitting: Obstetrics and Gynecology

## 2016-03-17 VITALS — BP 109/61 | HR 84 | Temp 98.5°F | Wt 209.4 lb

## 2016-03-17 DIAGNOSIS — Z23 Encounter for immunization: Secondary | ICD-10-CM | POA: Diagnosis not present

## 2016-03-17 DIAGNOSIS — Z3493 Encounter for supervision of normal pregnancy, unspecified, third trimester: Secondary | ICD-10-CM | POA: Diagnosis not present

## 2016-03-17 LAB — CBC
HEMATOCRIT: 27.9 % — AB (ref 36.0–46.0)
Hemoglobin: 9.5 g/dL — ABNORMAL LOW (ref 12.0–15.0)
MCH: 28.7 pg (ref 26.0–34.0)
MCHC: 34.1 g/dL (ref 30.0–36.0)
MCV: 84.3 fL (ref 78.0–100.0)
MPV: 11.1 fL (ref 8.6–12.4)
PLATELETS: 170 10*3/uL (ref 150–400)
RBC: 3.31 MIL/uL — ABNORMAL LOW (ref 3.87–5.11)
RDW: 13.6 % (ref 11.5–15.5)
WBC: 7.1 10*3/uL (ref 4.0–10.5)

## 2016-03-17 LAB — POCT URINALYSIS DIP (DEVICE)
Bilirubin Urine: NEGATIVE
GLUCOSE, UA: NEGATIVE mg/dL
HGB URINE DIPSTICK: NEGATIVE
KETONES UR: NEGATIVE mg/dL
NITRITE: NEGATIVE
PH: 7 (ref 5.0–8.0)
PROTEIN: 100 mg/dL — AB
SPECIFIC GRAVITY, URINE: 1.02 (ref 1.005–1.030)
UROBILINOGEN UA: 0.2 mg/dL (ref 0.0–1.0)

## 2016-03-17 NOTE — Patient Instructions (Addendum)
Third Trimester of Pregnancy The third trimester is from week 29 through week 42, months 7 through 9. The third trimester is a time when the fetus is growing rapidly. At the end of the ninth month, the fetus is about 20 inches in length and weighs 6-10 pounds.  BODY CHANGES Your body goes through many changes during pregnancy. The changes vary from woman to woman.   Your weight will continue to increase. You can expect to gain 25-35 pounds (11-16 kg) by the end of the pregnancy.  You may begin to get stretch marks on your hips, abdomen, and breasts.  You may urinate more often because the fetus is moving lower into your pelvis and pressing on your bladder.  You may develop or continue to have heartburn as a result of your pregnancy.  You may develop constipation because certain hormones are causing the muscles that push waste through your intestines to slow down.  You may develop hemorrhoids or swollen, bulging veins (varicose veins).  You may have pelvic pain because of the weight gain and pregnancy hormones relaxing your joints between the bones in your pelvis. Backaches may result from overexertion of the muscles supporting your posture.  You may have changes in your hair. These can include thickening of your hair, rapid growth, and changes in texture. Some women also have hair loss during or after pregnancy, or hair that feels dry or thin. Your hair will most likely return to normal after your baby is born.  Your breasts will continue to grow and be tender. A yellow discharge may leak from your breasts called colostrum.  Your belly button may stick out.  You may feel short of breath because of your expanding uterus.  You may notice the fetus "dropping," or moving lower in your abdomen.  You may have a bloody mucus discharge. This usually occurs a few days to a week before labor begins.  Your cervix becomes thin and soft (effaced) near your due date. WHAT TO EXPECT AT YOUR PRENATAL  EXAMS  You will have prenatal exams every 2 weeks until week 36. Then, you will have weekly prenatal exams. During a routine prenatal visit:  You will be weighed to make sure you and the fetus are growing normally.  Your blood pressure is taken.  Your abdomen will be measured to track your baby's growth.  The fetal heartbeat will be listened to.  Any test results from the previous visit will be discussed.  You may have a cervical check near your due date to see if you have effaced. At around 36 weeks, your caregiver will check your cervix. At the same time, your caregiver will also perform a test on the secretions of the vaginal tissue. This test is to determine if a type of bacteria, Group B streptococcus, is present. Your caregiver will explain this further. Your caregiver may ask you:  What your birth plan is.  How you are feeling.  If you are feeling the baby move.  If you have had any abnormal symptoms, such as leaking fluid, bleeding, severe headaches, or abdominal cramping.  If you are using any tobacco products, including cigarettes, chewing tobacco, and electronic cigarettes.  If you have any questions. Other tests or screenings that may be performed during your third trimester include:  Blood tests that check for low iron levels (anemia).  Fetal testing to check the health, activity level, and growth of the fetus. Testing is done if you have certain medical conditions or if   there are problems during the pregnancy.  HIV (human immunodeficiency virus) testing. If you are at high risk, you may be screened for HIV during your third trimester of pregnancy. FALSE LABOR You may feel small, irregular contractions that eventually go away. These are called Braxton Hicks contractions, or false labor. Contractions may last for hours, days, or even weeks before true labor sets in. If contractions come at regular intervals, intensify, or become painful, it is best to be seen by your  caregiver.  SIGNS OF LABOR   Menstrual-like cramps.  Contractions that are 5 minutes apart or less.  Contractions that start on the top of the uterus and spread down to the lower abdomen and back.  A sense of increased pelvic pressure or back pain.  A watery or bloody mucus discharge that comes from the vagina. If you have any of these signs before the 37th week of pregnancy, call your caregiver right away. You need to go to the hospital to get checked immediately. HOME CARE INSTRUCTIONS   Avoid all smoking, herbs, alcohol, and unprescribed drugs. These chemicals affect the formation and growth of the baby.  Do not use any tobacco products, including cigarettes, chewing tobacco, and electronic cigarettes. If you need help quitting, ask your health care provider. You may receive counseling support and other resources to help you quit.  Follow your caregiver's instructions regarding medicine use. There are medicines that are either safe or unsafe to take during pregnancy.  Exercise only as directed by your caregiver. Experiencing uterine cramps is a good sign to stop exercising.  Continue to eat regular, healthy meals.  Wear a good support bra for breast tenderness.  Do not use hot tubs, steam rooms, or saunas.  Wear your seat belt at all times when driving.  Avoid raw meat, uncooked cheese, cat litter boxes, and soil used by cats. These carry germs that can cause birth defects in the baby.  Take your prenatal vitamins.  Take 1500-2000 mg of calcium daily starting at the 20th week of pregnancy until you deliver your baby.  Try taking a stool softener (if your caregiver approves) if you develop constipation. Eat more high-fiber foods, such as fresh vegetables or fruit and whole grains. Drink plenty of fluids to keep your urine clear or pale yellow.  Take warm sitz baths to soothe any pain or discomfort caused by hemorrhoids. Use hemorrhoid cream if your caregiver approves.  If  you develop varicose veins, wear support hose. Elevate your feet for 15 minutes, 3-4 times a day. Limit salt in your diet.  Avoid heavy lifting, wear low heal shoes, and practice good posture.  Rest a lot with your legs elevated if you have leg cramps or low back pain.  Visit your dentist if you have not gone during your pregnancy. Use a soft toothbrush to brush your teeth and be gentle when you floss.  A sexual relationship may be continued unless your caregiver directs you otherwise.  Do not travel far distances unless it is absolutely necessary and only with the approval of your caregiver.  Take prenatal classes to understand, practice, and ask questions about the labor and delivery.  Make a trial run to the hospital.  Pack your hospital bag.  Prepare the baby's nursery.  Continue to go to all your prenatal visits as directed by your caregiver. SEEK MEDICAL CARE IF:  You are unsure if you are in labor or if your water has broken.  You have dizziness.  You have  mild pelvic cramps, pelvic pressure, or nagging pain in your abdominal area.  You have persistent nausea, vomiting, or diarrhea.  You have a bad smelling vaginal discharge.  You have pain with urination. SEEK IMMEDIATE MEDICAL CARE IF:   You have a fever.  You are leaking fluid from your vagina.  You have spotting or bleeding from your vagina.  You have severe abdominal cramping or pain.  You have rapid weight loss or gain.  You have shortness of breath with chest pain.  You notice sudden or extreme swelling of your face, hands, ankles, feet, or legs.  You have not felt your baby move in over an hour.  You have severe headaches that do not go away with medicine.  You have vision changes.   This information is not intended to replace advice given to you by your health care provider. Make sure you discuss any questions you have with your health care provider.   Document Released: 12/08/2001 Document  Revised: 01/04/2015 Document Reviewed: 02/14/2013 Elsevier Interactive Patient Education 2016 [employer] Laparoscopic Tubal Ligation Laparoscopic tubal ligation is a procedure that closes the fallopian tubes at a time other than right after childbirth. When the fallopian tubes are closed, the eggs that are released from the ovaries cannot enter the uterus, and sperm cannot reach the egg. Tubal ligation is also known as getting your "tubes tied." Tubal ligation is done so you will not be able to get pregnant or have a baby. Although this procedure may be undone (reversed), it should be considered permanent and irreversible. If you want to have future pregnancies, you should not have this procedure. LET [facility] CARE PROVIDER KNOW ABOUT:  Any allergies you have.  All medicines you are taking, including vitamins, herbs, eye drops, creams, and over-the-counter medicines. This includes any use of steroids, either by mouth or in cream form.  Previous problems you or members of your family have had with the use of anesthetics.  Any blood disorders you have.  Previous surgeries you have had.  Any medical conditions you may have.  Possibility of pregnancy, if this applies.  Any past pregnancies. RISKS AND COMPLICATIONS  Infection.  Bleeding.  Injury to surrounding organs.  Side effects from anesthetics.  Failure of the procedure.  Ectopic pregnancy.  Future regret about having the procedure done. BEFORE THE PROCEDURE  Ask your health care provider about:  Changing or stopping your regular medicines. This is especially important if you are taking diabetes medicines or blood thinners.  Taking medicines such as aspirin and ibuprofen. These medicines can thin your blood. Do not take these medicines before your procedure if your health care provider instructs you not to.  Follow instructions from your health care provider about eating and drinking restrictions.  Plan to have  someone take you home after the procedure.  If you go home right after the procedure, plan to have someone with you for 24 hours. PROCEDURE  You will be given one or more of the following:  A medicine that helps you relax (sedative).  A medicine that numbs the area (local anesthetic).  A medicine that makes you fall asleep (general anesthetic).  A medicine that is injected into an area of your body that numbs everything below the injection site (regional anesthetic).  If you have been given general anesthetic, a tube will be put down your throat to help you breathe.  Two small cuts (incisions) will be made in the lower abdominal area and near the  belly button.  Your bladder may be emptied with a small tube (catheter).  Your abdomen will be inflated with a safe gas (carbon dioxide). This will help to give the surgeon room to operate and visualize, and it will help the surgeon to avoid other organs.  A thin, lighted tube (laparoscope) with a camera attached will be inserted into your abdomen through one of the incisions near the belly button. Other small instruments will be inserted through the other abdominal incision.  The fallopian tubes will be tied off or burned (cauterized), or they will be blocked with a clip, ring, or clamp. In many cases, a small portion in the center of each fallopian tube will also be removed.  After the fallopian tubes are blocked, the gas will be released from the abdomen.  The incisions will be closed with stitches (sutures).  A bandage (dressing) will be placed over the incisions. The procedure may vary among health care providers and hospitals. AFTER THE PROCEDURE  Your blood pressure, heart rate, breathing rate, and blood oxygen level will be monitored often until the medicines you were given have worn off.  You will be given pain medicine as needed.  If you had general anesthetic, you may have some mild discomfort in your throat. This is from  the breathing tube that was placed in your throat while you were sleeping.  You may experience discomfort in the shoulder area from some trapped air between your liver and your diaphragm. This sensation is normal, and it will slowly go away on its own.  You will have some mild abdominal discomfort for 3--7 days.   This information is not intended to replace advice given to you by your health care provider. Make sure you discuss any questions you have with your health care provider.   Document Released: 03/22/2001 Document Revised: 04/30/2015 Document Reviewed: 03/26/2012 Elsevier Interactive Patient Education [employer]

## 2016-03-17 NOTE — Progress Notes (Signed)
Declined Flu 1 hour gtt Tdap given

## 2016-03-17 NOTE — Progress Notes (Signed)
Subjective:  Heather Kim is a [age] y.o. [id number] at 6540w5d being seen today for ongoing prenatal care.  She is currently monitored for the following issues for this low-risk pregnancy and has Supervision of normal pregnancy and Marijuana use on her problem list. Denies using currently.   Patient reports no complaints.  Contractions: Irregular. Vag. Bleeding: None.  Movement: Present. Denies leaking of fluid.   The following portions of the patient's history were reviewed and updated as appropriate: allergies, current medications, past family history, past medical history, past social history, past surgical history and problem list. Problem list updated.  Objective:   Filed Vitals:   03/17/16 1003  BP: 109/61  Pulse: 84  Temp: 98.5 F (36.9 C)  Weight: 209 lb 6.4 oz (94.983 kg)    Fetal Status: Fetal Heart Rate (bpm): 136   Movement: Present     General:  Alert, oriented and cooperative. Patient is in no acute distress.  Skin: Skin is warm and dry. No rash noted.   Cardiovascular: Normal heart rate noted  Respiratory: Normal respiratory effort, no problems with respiration noted  Abdomen: Soft, gravid, appropriate for gestational age. Pain/Pressure: Present     Pelvic: Vag. Bleeding: None     Cervical exam deferred        Extremities: Normal range of motion.     Mental Status: Normal mood and affect. Normal behavior. Normal judgment and thought content.   Urinalysis:      Assessment and Plan:  Pregnancy: [name] at 1240w5d  1. Need for Tdap vaccination Doing well - Glucose Tolerance, 1 HR (50g) - RPR - CBC - HIV antibody - Tdap vaccine greater than or equal to 7yo IM  Preterm labor symptoms and general obstetric precautions including but not limited to vaginal bleeding, contractions, leaking of fluid and fetal movement were reviewed in detail with the patient. Please refer to After Visit Summary for other counseling recommendations.  Return in about 2 weeks (around  03/31/2016). F/U UDS next month Desires PPS> consent today  Danae Orleanseirdre C Poe, CNM

## 2016-03-18 LAB — GLUCOSE TOLERANCE, 1 HOUR (50G) W/O FASTING: GLUCOSE, 1 HR, GESTATIONAL: 103 mg/dL (ref <140)

## 2016-03-18 LAB — HIV ANTIBODY (ROUTINE TESTING W REFLEX): HIV 1&2 Ab, 4th Generation: NONREACTIVE

## 2016-03-20 LAB — RPR

## 2016-04-01 ENCOUNTER — Encounter: Payer: Medicaid Other | Admitting: Family

## 2016-04-08 ENCOUNTER — Encounter: Payer: Medicaid Other | Admitting: Certified Nurse Midwife

## 2016-05-01 ENCOUNTER — Inpatient Hospital Stay (HOSPITAL_COMMUNITY)
Admission: AD | Admit: 2016-05-01 | Discharge: 2016-05-01 | Disposition: A | Discharge status: Home / Self Care | Payer: Medicaid Other | Source: Ambulatory Visit | Attending: Obstetrics & Gynecology | Admitting: Obstetrics & Gynecology

## 2016-05-01 ENCOUNTER — Encounter (HOSPITAL_COMMUNITY): Payer: Self-pay | Admitting: *Deleted

## 2016-05-01 DIAGNOSIS — Z3493 Encounter for supervision of normal pregnancy, unspecified, third trimester: Secondary | ICD-10-CM

## 2016-05-01 DIAGNOSIS — Z3A35 35 weeks gestation of pregnancy: Secondary | ICD-10-CM | POA: Insufficient documentation

## 2016-05-01 DIAGNOSIS — Z809 Family history of malignant neoplasm, unspecified: Secondary | ICD-10-CM | POA: Diagnosis not present

## 2016-05-01 DIAGNOSIS — O4703 False labor before 37 completed weeks of gestation, third trimester: Secondary | ICD-10-CM

## 2016-05-01 DIAGNOSIS — Z8489 Family history of other specified conditions: Secondary | ICD-10-CM | POA: Diagnosis not present

## 2016-05-01 DIAGNOSIS — O4693 Antepartum hemorrhage, unspecified, third trimester: Secondary | ICD-10-CM | POA: Diagnosis present

## 2016-05-01 LAB — URINE MICROSCOPIC-ADD ON

## 2016-05-01 LAB — URINALYSIS, ROUTINE W REFLEX MICROSCOPIC
BILIRUBIN URINE: NEGATIVE
GLUCOSE, UA: NEGATIVE mg/dL
HGB URINE DIPSTICK: NEGATIVE
Ketones, ur: 15 mg/dL — AB
NITRITE: NEGATIVE
PROTEIN: 30 mg/dL — AB
Specific Gravity, Urine: 1.02 (ref 1.005–1.030)
pH: 6.5 (ref 5.0–8.0)

## 2016-05-01 NOTE — MAU Provider Note (Signed)
History   [id number]   Chief Complaint  Patient presents with  . Vaginal Bleeding  . Labor Eval    HPI Heather Kim is a 30 y.o. female  [phone] at 3062w1d IUP here with report of irregular contractions that occur every 15-20 minutes.  Also report spotting of blood with wiping.  Pain is lower abdomen and back and intermittent in nature.  Denies leaking of fluid.  +fetal movement.  Denies any problems in pregnancy.    Patient's last menstrual period was 08/29/2015 (exact date).  OB History  Gravida Para Term Preterm AB SAB TAB Ectopic Multiple Living  4 2 2  1 1    2     # Outcome Date GA Lbr Len/2nd Weight Sex Delivery Anes PTL Lv  4 Current           3 Term           2 Term           1 SAB               Past Medical History  Diagnosis Date  . Migraine headache     Family History  Problem Relation Age of Onset  . Deep vein thrombosis Mother   . Cancer Father     Social History   Social History  . Marital Status: Single    Spouse Name: N/A  . Number of Children: N/A  . Years of Education: N/A   Social History Main Topics  . Smoking status: Never Smoker   . Smokeless tobacco: None  . Alcohol Use: No  . Drug Use: Yes    Special: Marijuana     Comment: past - last use September 2016  . Sexual Activity: Yes   Other Topics Concern  . None   Social History Narrative    No Known Allergies  No current facility-administered medications on file prior to encounter.   Current Outpatient Prescriptions on File Prior to Encounter  Medication Sig Dispense Refill  . Prenat w/o A Vit-FeFum-FePo-FA (CONCEPT OB) 130-92.4-1 MG CAPS Take 1 tablet by mouth daily. 30 capsule 12  . promethazine (PHENERGAN) 25 MG tablet Take 1 tablet (25 mg total) by mouth every 6 (six) hours as needed for nausea or vomiting. 30 tablet 1     Review of Systems  Genitourinary: Positive for vaginal bleeding and pelvic pain ( contractions). Negative for dysuria and frequency.   Musculoskeletal: Positive for back pain.  Neurological: Negative for headaches.  All other systems reviewed and are negative.    Physical Exam   Filed Vitals:   05/01/16 1808  BP: 118/65  Pulse: 89  Temp: 98.3 F (36.8 C)  TempSrc: Oral  Resp: 18    Physical Exam  Constitutional: She is oriented to person, place, and time. She appears well-developed and well-nourished. No distress.  HENT:  Head: Normocephalic.  Neck: Normal range of motion. Neck supple.  Cardiovascular: Normal rate, regular rhythm and normal heart sounds.   Respiratory: Effort normal and breath sounds normal. No respiratory distress.  GI: Soft. There is no tenderness.  Genitourinary: No bleeding ( speculum exam) in the vagina. Vaginal discharge (mucusy) found.  Musculoskeletal: Normal range of motion. She exhibits no edema.  Neurological: She is alert and oriented to person, place, and time. She has normal reflexes.  Skin: Skin is warm and dry.   Dilation: Closed Effacement (%): Thick Station: -2 Exam by:: Margarita MailW. Karim, CNM  FHR 130's, +accels Toco - irritability MAU Course  Procedures  MDM   Assessment and Plan  30 y.o. [id number] at [redacted]w[redacted]d IUP  Reactive NST Normal Exam  Plan: Discharge home Provided reassurance Keep scheduled appt  Marlis Edelson, CNM 05/01/2016 7:07 PM

## 2016-05-01 NOTE — MAU Note (Signed)
Noticed blood last night when she wiped, none today. No leaking.  Pain in lower back and lower abd since Wed, feels like labor

## 2016-05-30 ENCOUNTER — Encounter (HOSPITAL_COMMUNITY): Payer: Self-pay | Admitting: *Deleted

## 2016-05-30 ENCOUNTER — Inpatient Hospital Stay (HOSPITAL_COMMUNITY)
Admission: AD | Admit: 2016-05-30 | Discharge: 2016-06-02 | DRG: 775 | Disposition: A | Discharge status: Home / Self Care | Payer: Medicaid Other | Source: Ambulatory Visit | Attending: Family Medicine | Admitting: Family Medicine

## 2016-05-30 DIAGNOSIS — Z3493 Encounter for supervision of normal pregnancy, unspecified, third trimester: Secondary | ICD-10-CM

## 2016-05-30 DIAGNOSIS — Z3A39 39 weeks gestation of pregnancy: Secondary | ICD-10-CM

## 2016-05-30 DIAGNOSIS — IMO0001 Reserved for inherently not codable concepts without codable children: Secondary | ICD-10-CM

## 2016-05-30 DIAGNOSIS — O99824 Streptococcus B carrier state complicating childbirth: Principal | ICD-10-CM | POA: Diagnosis present

## 2016-05-30 NOTE — MAU Note (Signed)
Pt reports having ctx since 8am. Denies any vag bleeding or discharge. Good fetal movement reported.

## 2016-05-31 ENCOUNTER — Inpatient Hospital Stay (HOSPITAL_COMMUNITY): Payer: Medicaid Other | Admitting: Anesthesiology

## 2016-05-31 ENCOUNTER — Encounter (HOSPITAL_COMMUNITY): Payer: Self-pay | Admitting: *Deleted

## 2016-05-31 DIAGNOSIS — Z3A39 39 weeks gestation of pregnancy: Secondary | ICD-10-CM

## 2016-05-31 DIAGNOSIS — IMO0001 Reserved for inherently not codable concepts without codable children: Secondary | ICD-10-CM

## 2016-05-31 DIAGNOSIS — O99824 Streptococcus B carrier state complicating childbirth: Secondary | ICD-10-CM | POA: Diagnosis present

## 2016-05-31 LAB — CBC
HCT: 29.9 % — ABNORMAL LOW (ref 36.0–46.0)
Hemoglobin: 10.1 g/dL — ABNORMAL LOW (ref 12.0–15.0)
MCH: 27.3 pg (ref 26.0–34.0)
MCHC: 33.8 g/dL (ref 30.0–36.0)
MCV: 80.8 fL (ref 78.0–100.0)
PLATELETS: 177 10*3/uL (ref 150–400)
RBC: 3.7 MIL/uL — AB (ref 3.87–5.11)
RDW: 13.6 % (ref 11.5–15.5)
WBC: 8 10*3/uL (ref 4.0–10.5)

## 2016-05-31 LAB — TYPE AND SCREEN
ABO/RH(D): O POS
Antibody Screen: NEGATIVE

## 2016-05-31 LAB — RPR: RPR: NONREACTIVE

## 2016-05-31 LAB — OB RESULTS CONSOLE GBS: STREP GROUP B AG: POSITIVE

## 2016-05-31 LAB — GROUP B STREP BY PCR: Group B strep by PCR: POSITIVE — AB

## 2016-05-31 MED ORDER — ONDANSETRON HCL 4 MG/2ML IJ SOLN: 4.0000 mg | Freq: Four times a day (QID) | INTRAMUSCULAR | Status: DC | PRN

## 2016-05-31 MED ORDER — DIPHENHYDRAMINE HCL 25 MG PO CAPS: 25.0000 mg | ORAL_CAPSULE | Freq: Four times a day (QID) | ORAL | Status: DC | PRN

## 2016-05-31 MED ORDER — ONDANSETRON HCL 4 MG/2ML IJ SOLN: 4.0000 mg | INTRAMUSCULAR | Status: DC | PRN

## 2016-05-31 MED ORDER — LACTATED RINGERS IV SOLN
INTRAVENOUS | Status: DC
  Administered 2016-05-31: 02:00:00 via INTRAVENOUS

## 2016-05-31 MED ORDER — OXYTOCIN 40 UNITS IN LACTATED RINGERS INFUSION - SIMPLE MED
2.5000 [IU]/h | INTRAVENOUS | Status: DC
  Filled 2016-05-31: qty 1000

## 2016-05-31 MED ORDER — DIBUCAINE 1 % RE OINT: 1.0000 "application " | TOPICAL_OINTMENT | RECTAL | Status: DC | PRN

## 2016-05-31 MED ORDER — WITCH HAZEL-GLYCERIN EX PADS: 1.0000 "application " | MEDICATED_PAD | CUTANEOUS | Status: DC | PRN

## 2016-05-31 MED ORDER — FENTANYL 2.5 MCG/ML BUPIVACAINE 1/10 % EPIDURAL INFUSION (WH - ANES)
14.0000 mL/h | INTRAMUSCULAR | Status: DC | PRN
  Administered 2016-05-31: 14 mL/h via EPIDURAL
  Filled 2016-05-31: qty 125

## 2016-05-31 MED ORDER — LIDOCAINE HCL (PF) 1 % IJ SOLN
INTRAMUSCULAR | Status: DC | PRN
  Administered 2016-05-31 (×2): 5 mL

## 2016-05-31 MED ORDER — ACETAMINOPHEN 325 MG PO TABS
650.0000 mg | ORAL_TABLET | ORAL | Status: DC | PRN
  Administered 2016-05-31 – 2016-06-01 (×2): 650 mg via ORAL
  Filled 2016-05-31 (×2): qty 2

## 2016-05-31 MED ORDER — SIMETHICONE 80 MG PO CHEW
80.0000 mg | CHEWABLE_TABLET | ORAL | Status: DC | PRN
  Administered 2016-06-01: 80 mg via ORAL
  Filled 2016-05-31: qty 1

## 2016-05-31 MED ORDER — COCONUT OIL OIL: 1.0000 "application " | TOPICAL_OIL | Status: DC | PRN

## 2016-05-31 MED ORDER — SODIUM CHLORIDE 0.9 % IV SOLN: 2.0000 g | Freq: Four times a day (QID) | INTRAVENOUS | Status: DC

## 2016-05-31 MED ORDER — FENTANYL CITRATE (PF) 100 MCG/2ML IJ SOLN: 100.0000 ug | INTRAMUSCULAR | Status: DC | PRN

## 2016-05-31 MED ORDER — PHENYLEPHRINE 40 MCG/ML (10ML) SYRINGE FOR IV PUSH (FOR BLOOD PRESSURE SUPPORT)
80.0000 ug | PREFILLED_SYRINGE | INTRAVENOUS | Status: DC | PRN
  Filled 2016-05-31: qty 10
  Filled 2016-05-31: qty 5

## 2016-05-31 MED ORDER — BENZOCAINE-MENTHOL 20-0.5 % EX AERO
1.0000 "application " | INHALATION_SPRAY | CUTANEOUS | Status: DC | PRN
  Administered 2016-05-31: 1 via TOPICAL
  Filled 2016-05-31: qty 56

## 2016-05-31 MED ORDER — OXYTOCIN BOLUS FROM INFUSION: 500.0000 mL | INTRAVENOUS | Status: DC

## 2016-05-31 MED ORDER — PHENYLEPHRINE 40 MCG/ML (10ML) SYRINGE FOR IV PUSH (FOR BLOOD PRESSURE SUPPORT)
80.0000 ug | PREFILLED_SYRINGE | INTRAVENOUS | Status: DC | PRN
  Filled 2016-05-31: qty 5

## 2016-05-31 MED ORDER — SODIUM CHLORIDE 0.9 % IV SOLN: 1.0000 g | INTRAVENOUS | Status: DC

## 2016-05-31 MED ORDER — SOD CITRATE-CITRIC ACID 500-334 MG/5ML PO SOLN: 30.0000 mL | ORAL | Status: DC | PRN

## 2016-05-31 MED ORDER — DIPHENHYDRAMINE HCL 50 MG/ML IJ SOLN: 12.5000 mg | INTRAMUSCULAR | Status: DC | PRN

## 2016-05-31 MED ORDER — EPHEDRINE 5 MG/ML INJ
10.0000 mg | INTRAVENOUS | Status: DC | PRN
  Filled 2016-05-31: qty 2

## 2016-05-31 MED ORDER — IBUPROFEN 600 MG PO TABS
600.0000 mg | ORAL_TABLET | Freq: Four times a day (QID) | ORAL | Status: DC
  Administered 2016-05-31 – 2016-06-02 (×9): 600 mg via ORAL
  Filled 2016-05-31 (×9): qty 1

## 2016-05-31 MED ORDER — SODIUM CHLORIDE 0.9 % IV SOLN
2.0000 g | Freq: Once | INTRAVENOUS | Status: AC
  Administered 2016-05-31: 2 g via INTRAVENOUS
  Filled 2016-05-31: qty 2000

## 2016-05-31 MED ORDER — ACETAMINOPHEN 325 MG PO TABS
650.0000 mg | ORAL_TABLET | ORAL | Status: DC | PRN
  Administered 2016-05-31: 650 mg via ORAL
  Filled 2016-05-31: qty 2

## 2016-05-31 MED ORDER — ONDANSETRON HCL 4 MG PO TABS: 4.0000 mg | ORAL_TABLET | ORAL | Status: DC | PRN

## 2016-05-31 MED ORDER — LACTATED RINGERS IV SOLN
500.0000 mL | Freq: Once | INTRAVENOUS | Status: AC
  Administered 2016-05-31: 500 mL via INTRAVENOUS

## 2016-05-31 MED ORDER — LIDOCAINE HCL (PF) 1 % IJ SOLN
30.0000 mL | INTRAMUSCULAR | Status: AC | PRN
  Administered 2016-05-31: 30 mL via SUBCUTANEOUS
  Filled 2016-05-31: qty 30

## 2016-05-31 MED ORDER — PRENATAL MULTIVITAMIN CH
1.0000 | ORAL_TABLET | Freq: Every day | ORAL | Status: DC
  Administered 2016-05-31 – 2016-06-02 (×3): 1 via ORAL
  Filled 2016-05-31 (×3): qty 1

## 2016-05-31 MED ORDER — ZOLPIDEM TARTRATE 5 MG PO TABS: 5.0000 mg | ORAL_TABLET | Freq: Every evening | ORAL | Status: DC | PRN

## 2016-05-31 MED ORDER — LACTATED RINGERS IV SOLN: 500.0000 mL | INTRAVENOUS | Status: DC | PRN

## 2016-05-31 MED ORDER — SENNOSIDES-DOCUSATE SODIUM 8.6-50 MG PO TABS
2.0000 | ORAL_TABLET | ORAL | Status: DC
  Administered 2016-06-01 (×2): 2 via ORAL
  Filled 2016-05-31 (×2): qty 2

## 2016-05-31 MED ORDER — FLEET ENEMA 7-19 GM/118ML RE ENEM
1.0000 | ENEMA | RECTAL | Status: DC | PRN
Start: 2016-05-31 — End: 2016-05-31

## 2016-05-31 NOTE — Anesthesia Procedure Notes (Signed)
Epidural Patient location during procedure: OB  Staffing Anesthesiologist: Kaitlen Redford Performed by: anesthesiologist   Preanesthetic Checklist Completed: patient identified, site marked, surgical consent, pre-op evaluation, timeout performed, IV checked, risks and benefits discussed and monitors and equipment checked  Epidural Patient position: sitting Prep: DuraPrep Patient monitoring: heart rate, continuous pulse ox and blood pressure Approach: right paramedian Location: L3-L4 Injection technique: LOR saline  Needle:  Needle type: Tuohy  Needle gauge: 17 G Needle length: 9 cm and 9 Needle insertion depth: 6 cm Catheter type: closed end flexible Catheter size: 20 Guage Catheter at skin depth: 10 cm Test dose: negative  Assessment Events: blood not aspirated, injection not painful, no injection resistance, negative IV test and no paresthesia  Additional Notes Patient identified. Risks/Benefits/Options discussed with patient including but not limited to bleeding, infection, nerve damage, paralysis, failed block, incomplete pain control, headache, blood pressure changes, nausea, vomiting, reactions to medication both or allergic, itching and postpartum back pain. Confirmed with bedside nurse the patient's most recent platelet count. Confirmed with patient that they are not currently taking any anticoagulation, have any bleeding history or any family history of bleeding disorders. Patient expressed understanding and wished to proceed. All questions were answered. Sterile technique was used throughout the entire procedure. Please see nursing notes for vital signs. Test dose was given through epidural needle and negative prior to continuing to dose epidural or start infusion. Warning signs of high block given to the patient including shortness of breath, tingling/numbness in hands, complete motor block, or any concerning symptoms with instructions to call for help. Patient was given  instructions on fall risk and not to get out of bed. All questions and concerns addressed with instructions to call with any issues.   

## 2016-05-31 NOTE — Anesthesia Postprocedure Evaluation (Signed)
Anesthesia Post Note  Patient: Heather Kim  Procedure(s) Performed: * No procedures listed *  Patient location during evaluation: Mother Baby Anesthesia Type: Epidural Level of consciousness: awake and alert Pain management: pain level controlled Vital Signs Assessment: post-procedure vital signs reviewed and stable Respiratory status: spontaneous breathing, nonlabored ventilation and respiratory function stable Cardiovascular status: stable Postop Assessment: no headache, no backache and epidural receding Anesthetic complications: no     Last Vitals:  Filed Vitals:   05/31/16 0640 05/31/16 0745  BP: 110/63 102/60  Pulse: 62 66  Temp: 36.5 C 36.6 C  Resp: 18 18    Last Pain:  Filed Vitals:   05/31/16 0754  PainSc: 0-No pain   Pain Goal: Patients Stated Pain Goal: 0 (05/30/16 2249)               Junious SilkGILBERT,Eward Rutigliano

## 2016-05-31 NOTE — Lactation Note (Signed)
This note was copied from a baby's chart. Lactation Consultation Note  Experienced BF mother reports that BF is going well.  Hand expression taught with colostrum visible.  Asked mother to call for latch check at the next feeding.  Informed of support groups and OP services,  Patient Name: Heather Darden AmberLaquanda Lennon [id number]'s Date: 05/31/2016 Reason for consult: Initial assessment   Maternal Data Has patient been taught Hand Expression?: Yes  Feeding Feeding Type: Breast Fed Length of feed: 10 min  LATCH Score/Interventions                      Lactation Tools Discussed/Used     Consult Status Consult Status: Follow-up Date: 06/01/16 Follow-up type: In-patient    Soyla DryerJoseph, Yasemin Rabon 05/31/2016, 11:43 AM

## 2016-05-31 NOTE — Anesthesia Preprocedure Evaluation (Signed)

## 2016-05-31 NOTE — H&P (Signed)
LABOR ADMISSION HISTORY AND PHYSICAL  Heather Kim is a 30 y.o. female [phone] with IUP at [redacted]w[redacted]d by LMP c/w 6 wk [place] presenting for contraction since 8:30 yesterday. She reports regular contraction every42minutes. Fetal movement present and at baseline. Reports a mucus with blood streak for two weeks. Denies fluid leak or gush, profuse vaginal bleeding, headaches, blurry vision, RUQ pain or peripheral edema. She plans on breast and bottle feeding. She request interval BTL for birth control.  Wants epidural for pain  Dating: By LMP c/w 6w [place] --->  Estimated Date of Delivery: 06/04/16   Prenatal History/Complications: [facility] Prenatal Labs  Dating LMP/6 week [place] Blood type: --/--/O POS (06/04 0123) O pos  Genetic Screen     Quad: Neg    NIPS: Antibody:NEG (06/04 0123)Neg  Anatomic [place] Normal female Rubella: 1.49 (12/14 1017)Immune  GTT Early:               Third trimester: 103 RPR: NON REAC (03/21 1115)   Flu vaccine     Needs HBsAg: NEGATIVE (12/14 1017) Neg  TDaP vaccine            03/17/16                          HIV: NONREACTIVE (03/21 1115)   Baby Food  Breast                                         AVW:UJWJXBJY (06/04 0000)  Contraception  Interval BTL Pap:WNL  Circumcision     Pediatrician    Support Person     Past Medical History: Past Medical History  Diagnosis Date  . Migraine headache     Past Surgical History: Past Surgical History  Procedure Laterality Date  . No past surgeries      Obstetrical History: OB History    Gravida Para Term Preterm AB TAB SAB Ectopic Multiple Living   Social History: Social History   Social History  . Marital Status: Single    Spouse Name: N/A  . Number of Children: N/A  . Years of Education: N/A   Social History Main Topics  . Smoking status: Never Smoker   . Smokeless tobacco: None  . Alcohol Use: No  . Drug Use: Yes    Special: Marijuana     Comment: past - last use September 2016  . Sexual  Activity: Yes   Other Topics Concern  . None   Social History Narrative    Family History: Family History  Problem Relation Age of Onset  . Deep vein thrombosis Mother   . Cancer Father     Allergies: No Known Allergies  Prescriptions prior to admission  Medication Sig Dispense Refill Last Dose  . Prenat w/o A Vit-FeFum-FePo-FA (CONCEPT OB) 130-92.4-1 MG CAPS Take 1 tablet by mouth daily. 30 capsule 12 Taking  . promethazine (PHENERGAN) 25 MG tablet Take 1 tablet (25 mg total) by mouth every 6 (six) hours as needed for nausea or vomiting. 30 tablet 1 Taking     Review of Systems  Blood pressure 127/82, pulse 77, temperature 98.2 F (36.8 C), temperature source Oral, resp. rate 18, last menstrual period 08/29/2015. GEN: appearance: alert, cooperative and appears stated age. She appears in a lot  of pain from cotraction RESP: clear to auscultation bilaterally, no increased WOB CVS:: regular rate and rhythm, no murmurs, no sign of DVT, +2 DP GI: soft, non-tender; bowel sounds normal MSK: WWP, Homans sign is negative  Pelvic Exam: Cervical exam: Dilation: 5 Effacement (%): 80 Station: -2, -1 Exam by:: K.WIlson,RN Presentation: cephalic Uterine activityDate/time of onset: 8 am on 05/30/2016  Fetal monitoringBaseline: 110/mod vari/no decels bpm  Prenatal labs: ABO, Rh: O/POS/-- (12/14 1017) Antibody: NEG (12/14 1017) Rubella: !immune RPR: NON REAC (03/21 1115)  HBsAg: NEGATIVE (12/14 1017)  HIV: NONREACTIVE (03/21 1115)  GBS:   Positive here 1 hr Glucola negative Genetic screening: QUAD negative  Prenatal Transfer Tool  Maternal Diabetes: No Genetic Screening: Normal Maternal Ultrasounds/Referrals: Normal Fetal Ultrasounds or other Referrals:  None Maternal Substance Abuse:  No (marijuana in the past per EMR Significant Maternal Medications:  None Significant Maternal Lab Results: Lab values include: Group B Strep positive  No results found for this or any  previous visit (from the past 24 hour(s)).  Patient Active Problem List   Diagnosis Date Noted  . Marijuana use 12/21/2015  . Supervision of normal pregnancy 12/11/2015    Assessment: Heather Kim is a 30 y.o. [id number] at 463w3d here for active labor. Patient wasn't seen in clinic since she was 775w5d.  #Labor: expectant #Pain: epidural #FWB: CAT-1 #ID: GBS positive. Ampicillin once, then penicillin #MOF: breast and bottle #MOC: interval BTL. Consent hasn't been signed #Circ: will assess  Taye T Gonfa 05/31/2016, 1:12 AM  OB fellow attestation: I have seen and examined this patient; I agree with above documentation in the resident's note.   Heather Kim is a 30 y.o. [phone] here for contractions with advanced dilation  PE: BP 113/61 mmHg  Pulse 72  Temp(Src) 97.7 F (36.5 C) (Oral)  Resp 18  Ht 5\' 4"  (1.626 m)  Wt 212 lb (96.163 kg)  BMI 36.37 kg/m2  SpO2 100%  LMP 08/29/2015 (Exact Date) Gen: calm comfortable, NAD Resp: normal effort, no distress Abd: gravid  ROS, labs, PMH reviewed  Plan: Admit to LD Labor: Admit, expectant managment FWB:Cat I ID: GBS PCR - POS, will give ampicillin.  MOF: both Circ- planning outpatient  Federico FlakeKimberly Niles Fayette Gasner, MD  Family Medicine, OB Fellow 05/31/2016, 4:26 AM

## 2016-06-01 NOTE — Progress Notes (Signed)
UR chart review completed.  

## 2016-06-01 NOTE — Progress Notes (Signed)
Post Partum Day 1 Subjective:  Heather Kim is a 30 y.o. [id number] 6618w3d s/p SVD.  No acute events overnight.  Pt denies problems with ambulating, voiding or po intake.  She denies nausea or vomiting.  Pain is well controlled. Lochia Minimal.  Plan for birth control is interval bilateral tubal ligation.  Method of Feeding: breast and bottle  Objective: Blood pressure 114/72, pulse 70, temperature 98.5 F (36.9 C), temperature source Oral, resp. rate 18, height 5\' 4"  (1.626 m), weight 212 lb (96.163 kg), last menstrual period 08/29/2015, SpO2 97 %, unknown if currently breastfeeding.  Physical Exam:  General: alert, cooperative and no distress Lochia:normal flow Chest: normal WOB Heart: Regular rate Abdomen: +BS, soft, mild TTP (appropriate) Uterine Fundus: firm DVT Evaluation: No evidence of DVT seen on physical exam. Extremities: no edema   Recent Labs  05/31/16 0123  HGB 10.1*  HCT 29.9*    Assessment/Plan:  ASSESSMENT: Heather LatusLaquanda L Malecha is a 30 y.o. [id number] 3818w3d s/p SVD. GBS inadequately treated.  Plan for discharge tomorrow Continue routine PP care Breastfeeding support PRN  LOS: 1 day   Taye T Gonfa 06/01/2016, 7:15 AM

## 2016-06-02 ENCOUNTER — Encounter (HOSPITAL_COMMUNITY): Payer: Self-pay | Admitting: Family Medicine

## 2016-06-02 MED ORDER — ACETAMINOPHEN 325 MG PO TABS: 650.0000 mg | ORAL_TABLET | ORAL | Status: DC | PRN

## 2016-06-02 MED ORDER — IBUPROFEN 600 MG PO TABS: 600.0000 mg | ORAL_TABLET | Freq: Four times a day (QID) | ORAL | Status: DC

## 2016-06-02 NOTE — Discharge Instructions (Signed)

## 2016-06-02 NOTE — Discharge Summary (Signed)
OB Discharge Summary     Patient Name: Heather Kim DOB: 01/05/1986 MRN: [id number]  Date of admission: 05/30/2016 Delivering MD: Lyndel SafeNEWTON, KIMBERLY NILES   Date of discharge: 06/02/2016  Admitting diagnosis: LABOR Intrauterine pregnancy: 3365w3d     Secondary diagnosis:  Active Problems:   Active labor   NSVD (normal spontaneous vaginal delivery)  Additional problems: none     Discharge diagnosis: Term Pregnancy Delivered                                                                                                Post partum procedures:none  Augmentation: none  Complications: None  Hospital course:  Onset of Labor With Vaginal Delivery     30 y.o. yo [id number] at 4765w3d was admitted in Active Labor on 05/30/2016. Patient had an uncomplicated labor course as follows:  Membrane Rupture Time/Date: 3:20 AM ,05/31/2016   Intrapartum Procedures: Episiotomy: None [1]                                         Lacerations:  1st degree [2];Periurethral [8];Perineal [11]  Patient had a delivery of a Viable infant. 05/31/2016  Information for the patient's newborn:  Forde RadonSwann, Boy Grisell [[id number]][[id number]]  Delivery Method: Vaginal, Spontaneous Delivery (Filed from Delivery Summary)    Pateint had an uncomplicated postpartum course.  She is ambulating, tolerating a regular diet, passing flatus, and urinating well. Patient is discharged home in stable condition on 06/02/2016.    Physical exam  Filed Vitals:   05/31/16 2000 06/01/16 0603 06/01/16 1831 06/02/16 0535  BP: 110/54 114/72 107/54 111/74  Pulse: 75 70 77 86  Temp: 98.7 F (37.1 C) 98.5 F (36.9 C) 98.3 F (36.8 C) 98.1 F (36.7 C)  TempSrc: Oral Oral Oral Oral  Resp: 18 18 18 18   Height:      Weight:      SpO2: 97%      General: alert, cooperative and no distress Lochia: appropriate Uterine Fundus: firm DVT Evaluation: No evidence of DVT seen on physical exam. Labs: Lab Results  Component Value Date   WBC 8.0 05/31/2016   HGB 10.1* 05/31/2016   HCT 29.9* 05/31/2016   MCV 80.8 05/31/2016   PLT 177 05/31/2016   CMP Latest Ref Rng 10/10/2015  Glucose 65 - 99 mg/dL 82  BUN 6 - 20 mg/dL 8  Creatinine 1.300.44 - 8.651.00 mg/dL 7.840.81  Sodium 696135 - 295145 mmol/L 138  Potassium 3.5 - 5.1 mmol/L 3.3(L)  Chloride 101 - 111 mmol/L 104  CO2 22 - 32 mmol/L 25  Calcium 8.9 - 10.3 mg/dL 9.7  Total Protein 6.5 - 8.1 g/dL -  Total Bilirubin 0.3 - 1.2 mg/dL -  Alkaline Phos 38 - 284126 U/L -  AST 15 - 41 U/L -  ALT 14 - 54 U/L -    Discharge instruction: per After Visit Summary and "Baby and Me Booklet".  After visit meds:    Medication List    TAKE these medications  acetaminophen 325 MG tablet  Commonly known as:  TYLENOL  Take 2 tablets (650 mg total) by mouth every 4 (four) hours as needed (for pain scale < 4).     ibuprofen 600 MG tablet  Commonly known as:  ADVIL,MOTRIN  Take 1 tablet (600 mg total) by mouth every 6 (six) hours.        Diet: routine diet  Activity: Advance as tolerated. Pelvic rest for 6 weeks.   Outpatient follow up:6 weeks Follow up Appt:No future appointments. Follow up Visit:No Follow-up on file.  Postpartum contraception: Interval Tubal Ligation  Newborn Data: Live born female  Birth Weight: 7 lb 6.3 oz (3354 g) APGAR: 8, 9  Baby Feeding: Bottle and Breast Disposition:home with mother   06/02/2016 Almon Hercules, MD   OB FELLOW DISCHARGE ATTESTATION  I have seen and examined this patient and agree with above documentation in the resident's note.   Silvano Bilis, MD 9:59 PM

## 2016-06-04 IMAGING — CT CT ABD-PELV W/ CM
1 of 4 series · 2 of 46 positions shown, 3 images · IV contrast (Iodine)
Comparison: None.

CLINICAL DATA: Left-sided abdominal pain and headache.  Nausea.

EXAM:
CT ABDOMEN AND PELVIS WITH CONTRAST
TECHNIQUE: Multidetector CT imaging of the abdomen and pelvis was performed
using the standard protocol following bolus administration of
intravenous contrast.
CONTRAST:  100mL OMNIPAQUE IOHEXOL 300 MG/ML  SOLN

[Series 206: coronals · coronal · 0.45mm/px · 2 of 126 slices shown, 3 images]
[im 42/126  soft-tissue]
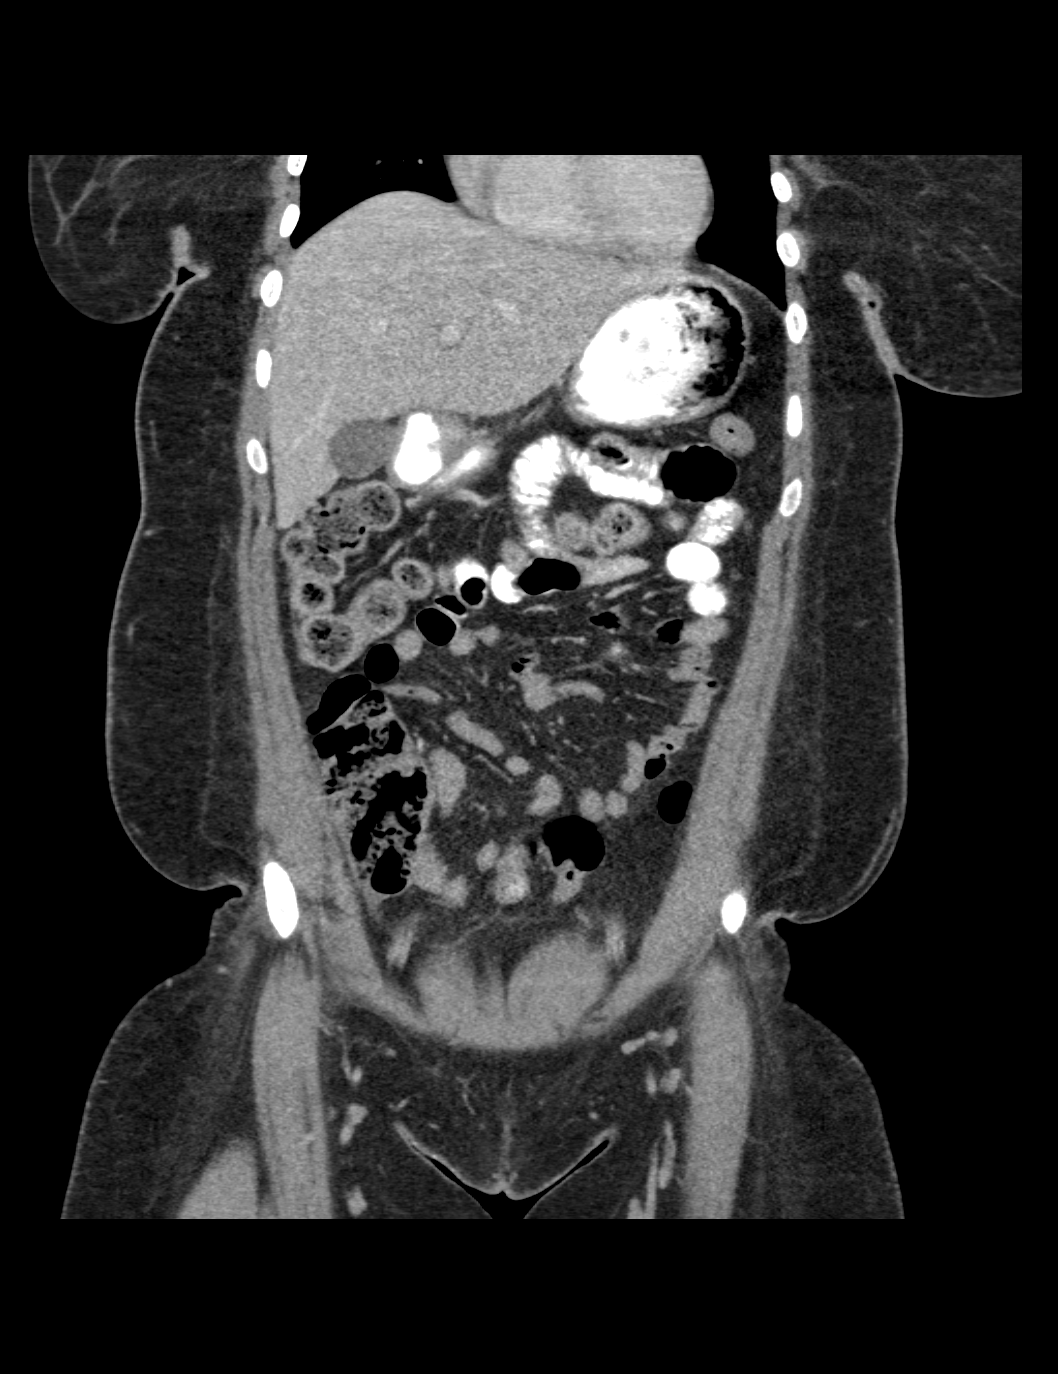
[im 42/126  bone]
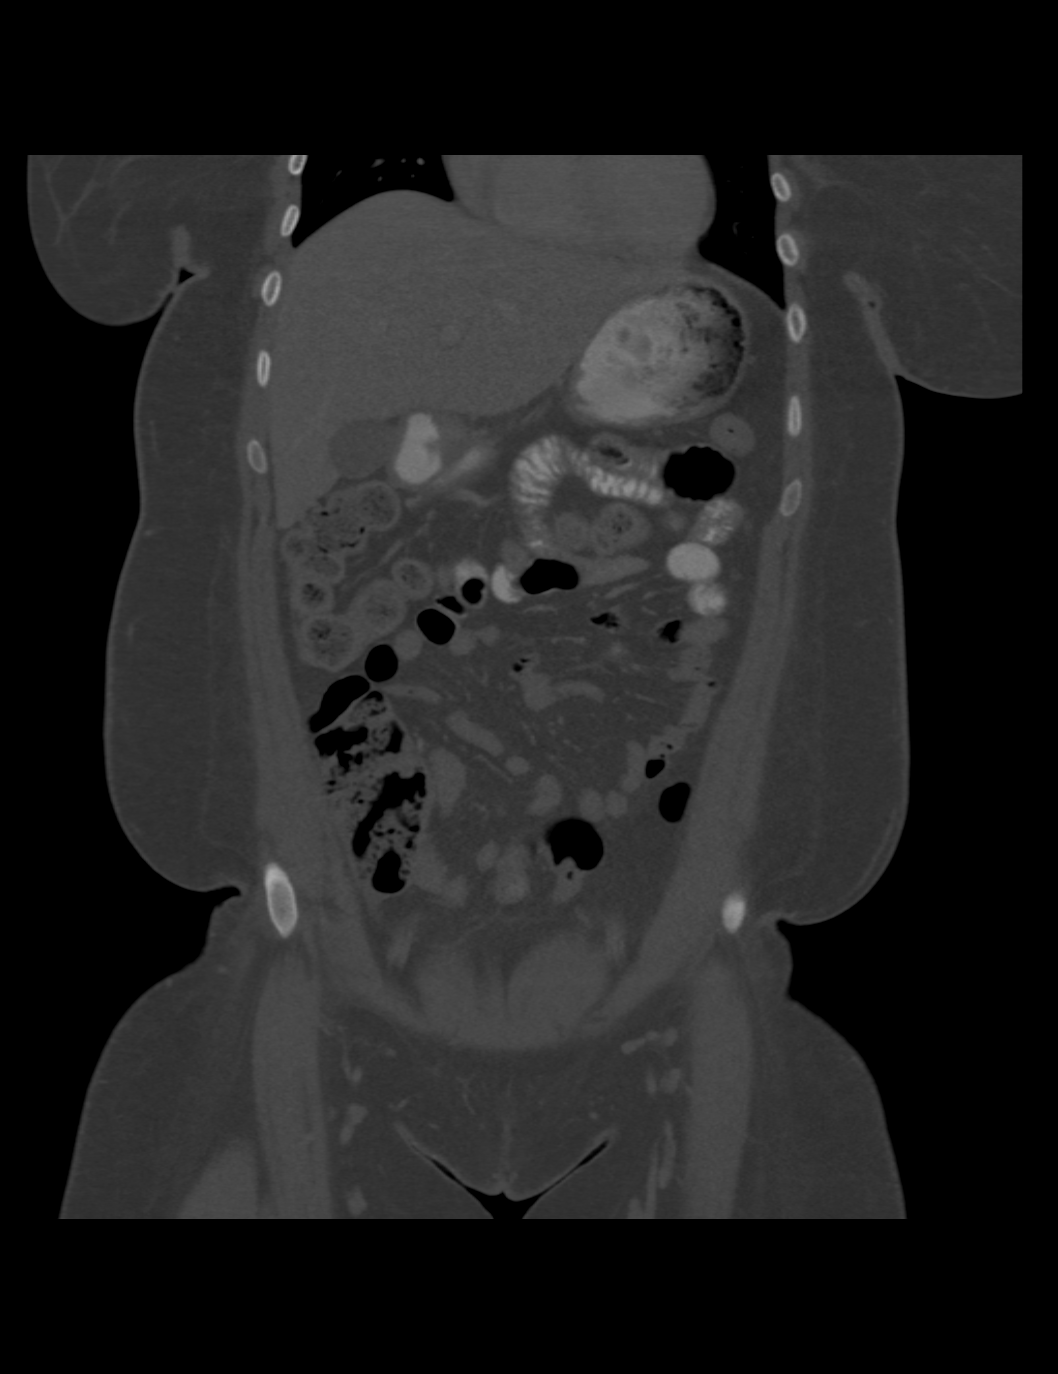
[im 98/126  soft-tissue]
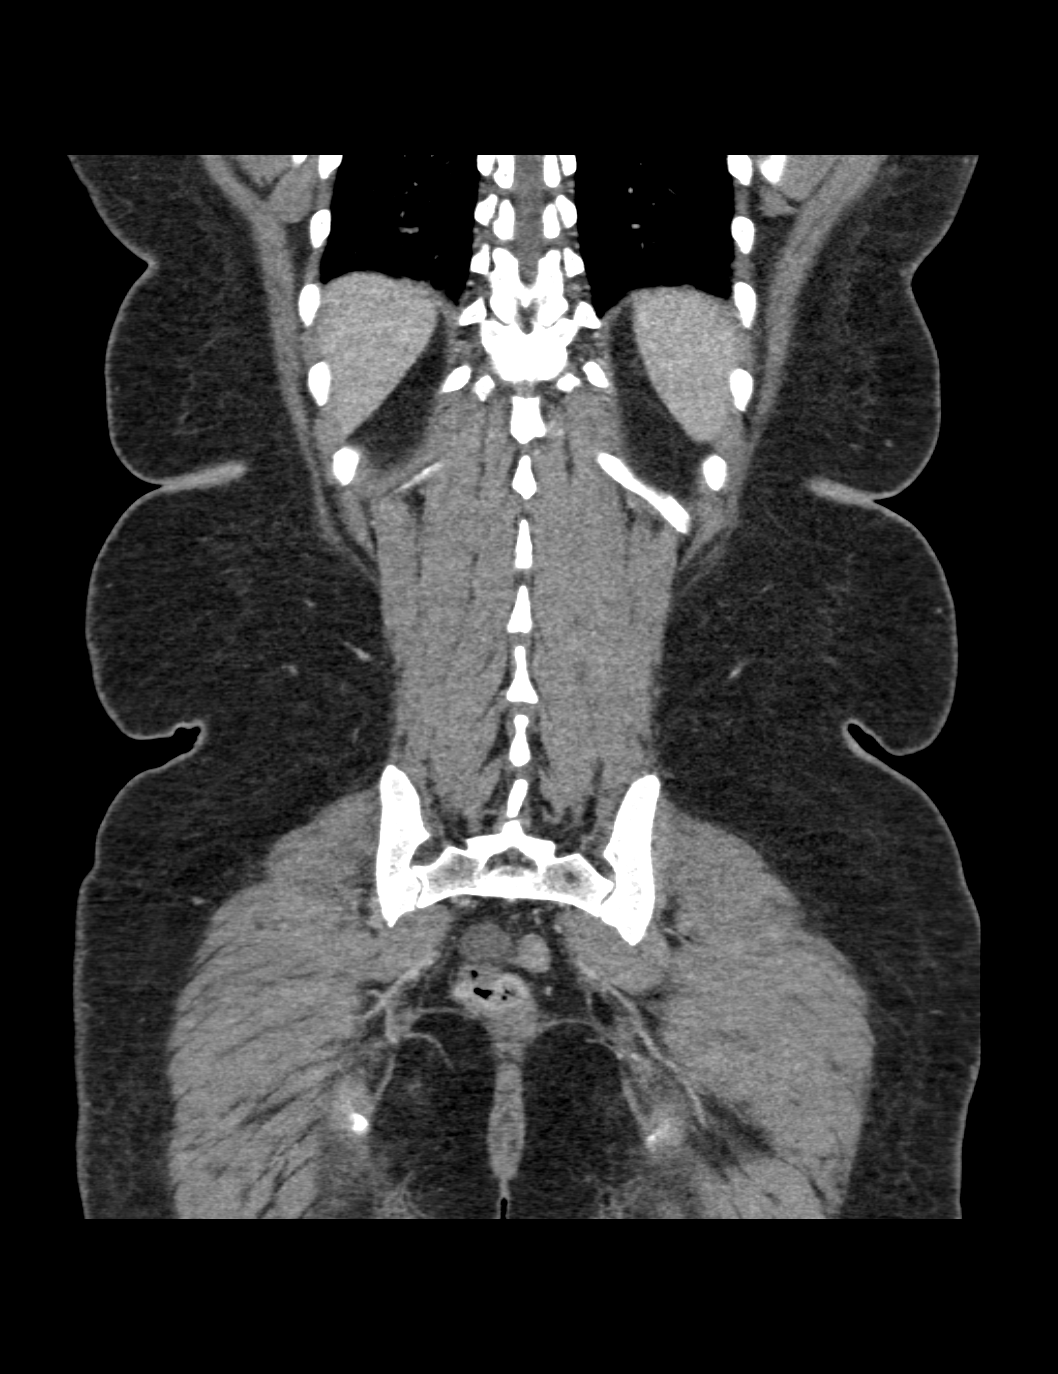

[2 of 46 positions shown; findings below may reference images not displayed]

FINDINGS: Lower chest:  Normal.

Hepatobiliary: Normal.

Pancreas: Normal.

Spleen: Normal.

Adrenals/Urinary Tract: There is a 2 cm area of abnormal poorly
defined low-density at the anterior aspect of the lower pole of the
left kidney with soft tissue stranding of the perinephric fat and
along the inferior aspect of Gerota's fascia consistent with focal
pyelonephritis.

The adrenal glands and right kidney are normal. Bladder is normal.
Ureters are not distended.

Stomach/Bowel: Normal.

Vascular/Lymphatic: Normal.

Reproductive: There is a small amount free fluid in the pelvis with
areas of fluid density in the adnexa. The possibility of
hydrosalpinx should be considered. Uterus is normal.

Other: No free air.

Musculoskeletal: Normal.
IMPRESSION: 1. Focal pyelonephritis of the lower pole of the left kidney.
2. Small amount of free fluid in the pelvis with fluid in the
adnexal regions which may represent hydrosalpinges.

## 2016-07-29 ENCOUNTER — Ambulatory Visit: Payer: Self-pay | Admitting: Obstetrics and Gynecology

## 2016-12-28 NOTE — L&D Delivery Note (Signed)
Delivery Note At 5:32 PM a viable female was delivered via Vaginal, Spontaneous Delivery (Presentation: OA).  APGAR: 9, 9; weight  .   Placenta status: via Tomasa BlaseSchultz, intact.  Cord: 3VC with no complications.  Anesthesia: epidural Episiotomy: None Lacerations: 1st degree perineal Suture Repair: 3.0 vicryl Est. Blood Loss (mL): 150  Mom to postpartum.  Baby to Couplet care / Skin to Skin.  Raelyn Moraolitta Brissia Delisa, MSN, CNM 10/21/2017, 6:18 PM

## 2017-04-19 ENCOUNTER — Encounter: Payer: Self-pay | Admitting: *Deleted

## 2017-04-19 ENCOUNTER — Ambulatory Visit (INDEPENDENT_AMBULATORY_CARE_PROVIDER_SITE_OTHER): Payer: Medicaid Other | Admitting: *Deleted

## 2017-04-19 ENCOUNTER — Encounter: Payer: Self-pay | Admitting: General Practice

## 2017-04-19 DIAGNOSIS — Z3201 Encounter for pregnancy test, result positive: Secondary | ICD-10-CM | POA: Diagnosis present

## 2017-04-19 LAB — POCT PREGNANCY, URINE: PREG TEST UR: POSITIVE — AB

## 2017-04-19 MED ORDER — PRENATAL VITAMINS 0.8 MG PO TABS: 1.0000 | ORAL_TABLET | Freq: Every day | ORAL | 12 refills | Status: DC

## 2017-04-19 MED ORDER — ONDANSETRON HCL 4 MG PO TABS: 4.0000 mg | ORAL_TABLET | Freq: Three times a day (TID) | ORAL | 1 refills | Status: DC | PRN

## 2017-04-19 NOTE — Progress Notes (Signed)
Patient presents to clinic for pregnancy test which is positive. EDD 11/01/17. Meds and allergies reviewed. Discussed avoiding smoking, drugs and alcohol, healthy lifestyle. Patient requests prescription for pnv and nausea med, stated difficult to take vitamins d/t nausea. Order received from Dr Alysia Penna, sent to pharmacy. Patient to front desk to schedule initial prenatal visit, get pregnancy verification letter.

## 2017-05-31 LAB — OB RESULTS CONSOLE ANTIBODY SCREEN: Antibody Screen: NEGATIVE

## 2017-05-31 LAB — OB RESULTS CONSOLE RPR: RPR: NONREACTIVE

## 2017-05-31 LAB — OB RESULTS CONSOLE HEPATITIS B SURFACE ANTIGEN: Hepatitis B Surface Ag: NEGATIVE

## 2017-05-31 LAB — OB RESULTS CONSOLE GC/CHLAMYDIA
CHLAMYDIA, DNA PROBE: NEGATIVE
GC PROBE AMP, GENITAL: NEGATIVE

## 2017-05-31 LAB — OB RESULTS CONSOLE HIV ANTIBODY (ROUTINE TESTING): HIV: NONREACTIVE

## 2017-05-31 LAB — OB RESULTS CONSOLE ABO/RH: RH Type: POSITIVE

## 2017-05-31 LAB — OB RESULTS CONSOLE RUBELLA ANTIBODY, IGM: Rubella: IMMUNE

## 2017-06-11 ENCOUNTER — Encounter: Payer: Self-pay | Admitting: Obstetrics & Gynecology

## 2017-06-14 IMAGING — US US PELVIS COMPLETE
1 series · 13 of 25 positions shown · non-contrast
Comparison: None

CLINICAL DATA: Pelvic inflammatory disease. Pelvic pain. Symptoms
for 1 week.

EXAM:
TRANSABDOMINAL AND TRANSVAGINAL ULTRASOUND OF PELVIS
TECHNIQUE: Both transabdominal and transvaginal ultrasound examinations of the
pelvis were performed. Transabdominal technique was performed for
global imaging of the pelvis including uterus, ovaries, adnexal
regions, and pelvic cul-de-sac. It was necessary to proceed with
endovaginal exam following the transabdominal exam to visualize the
uterus, endometrium and ovaries/adnexa to better advantage.

[Series 1: us pelvis complete · 0.24mm/px · 79 acquisitions, 13 frames shown]
[im 1/79]
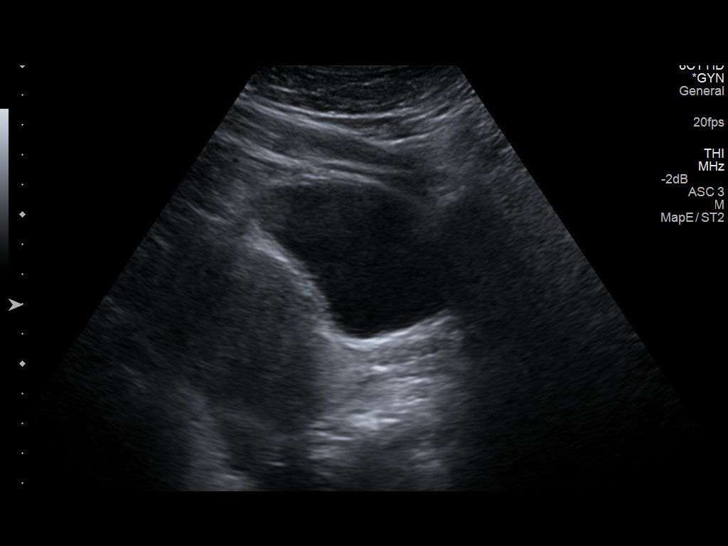
[im 7/79]
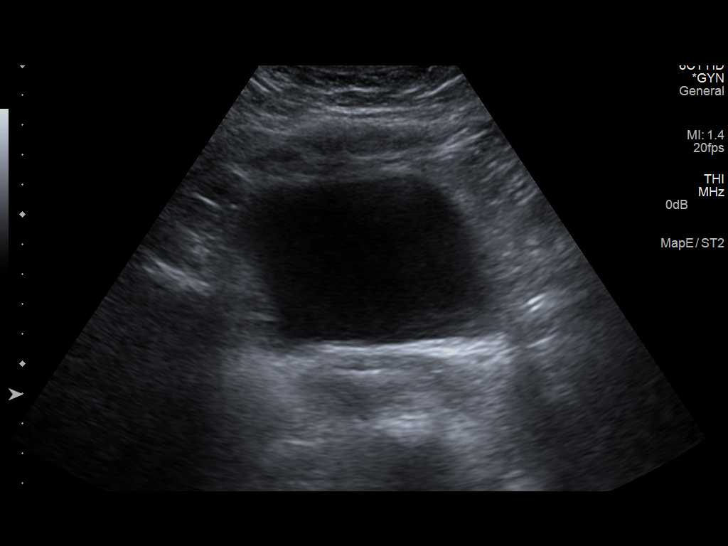
[im 14/79]
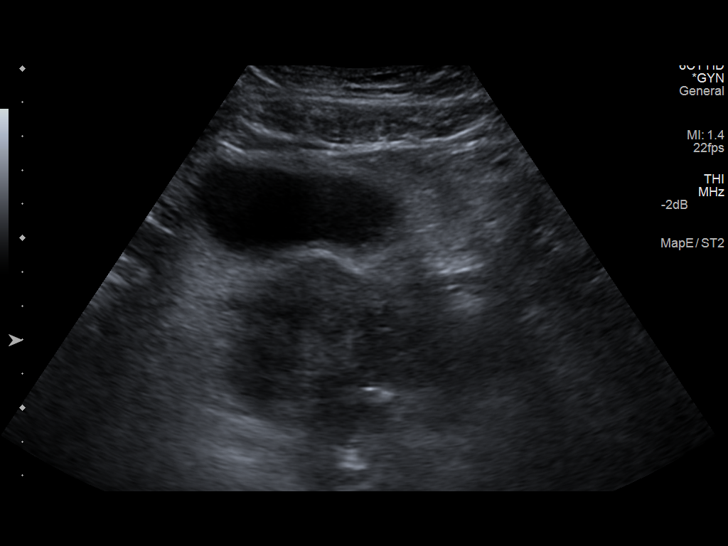
[im 20/79]
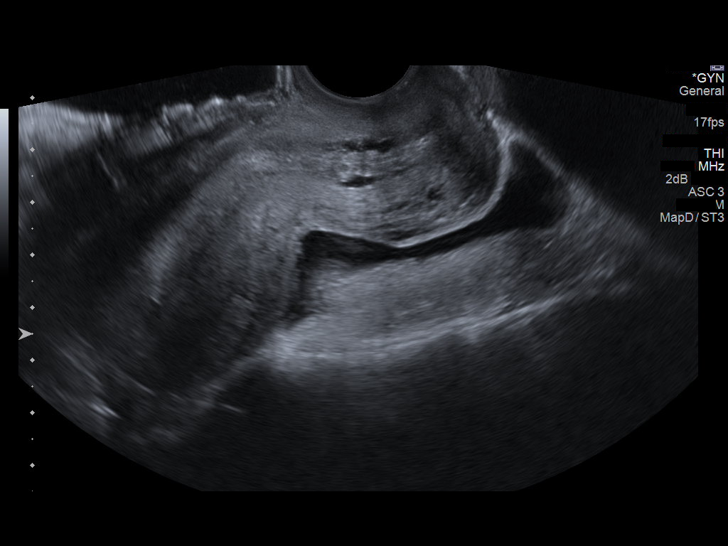
[im 27/79]
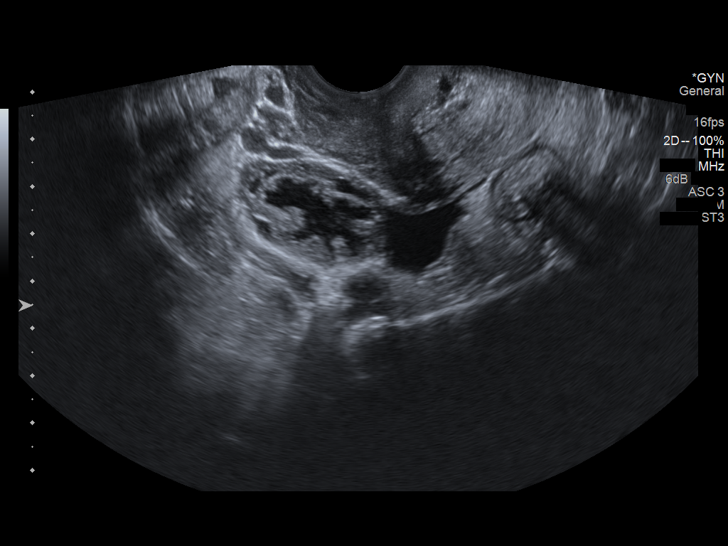
[im 33/79]
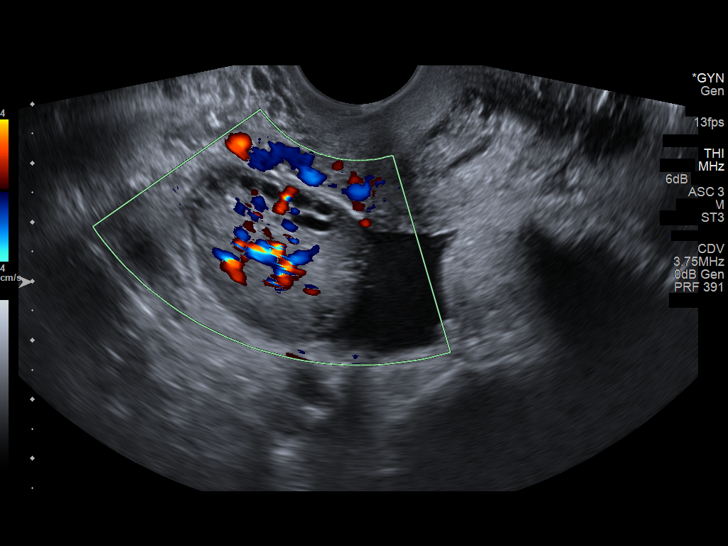
[im 40/79]
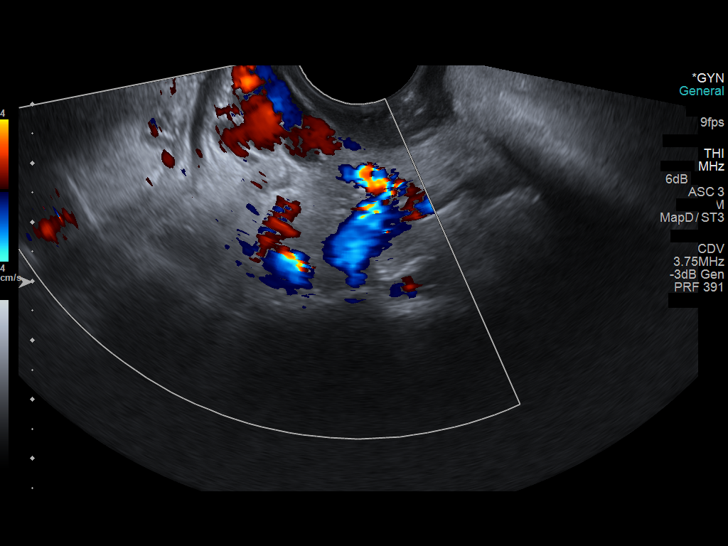
[im 46/79]
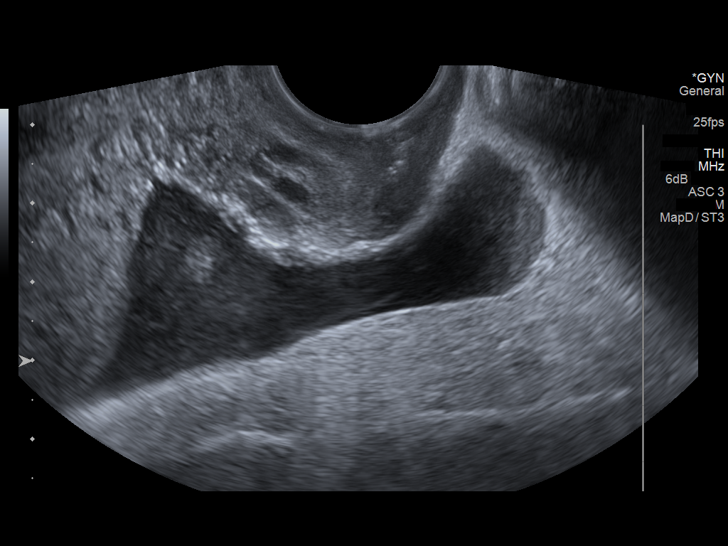
[im 53/79]
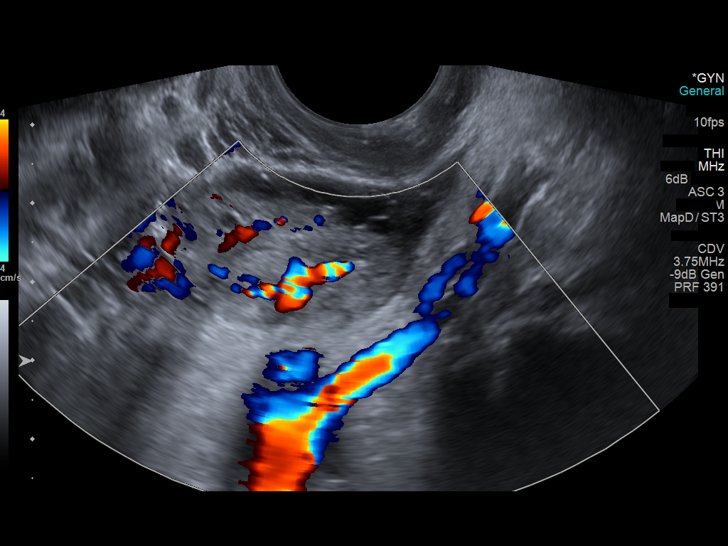
[im 59/79]
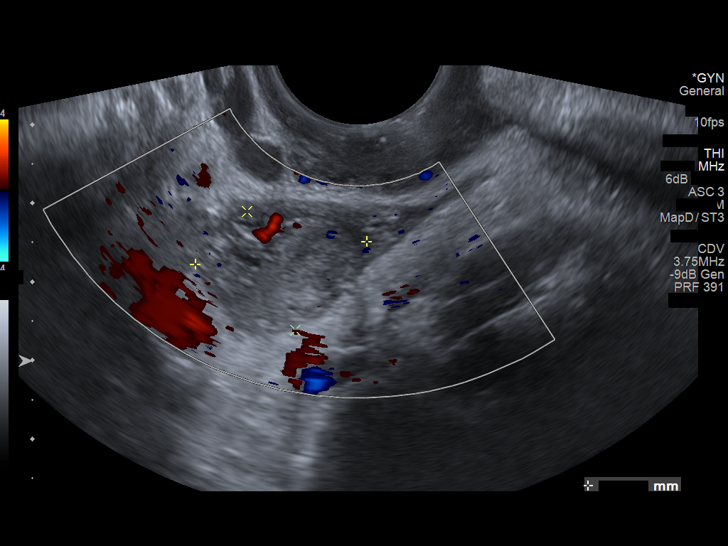
[im 66/79]
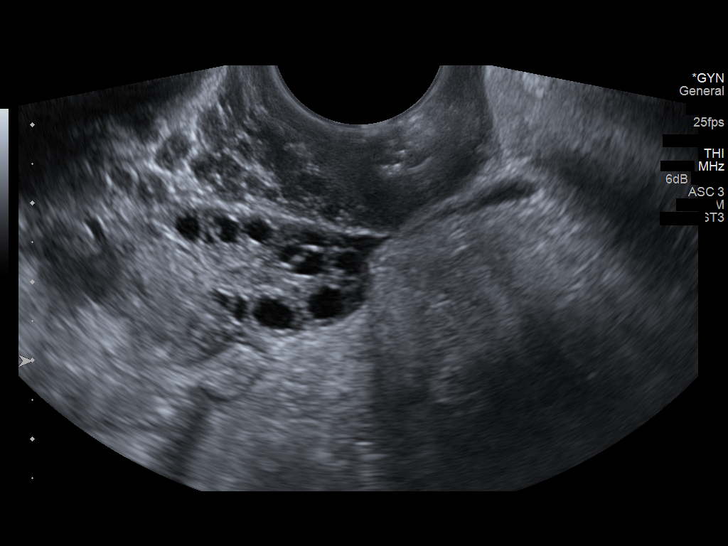
[im 72/79]
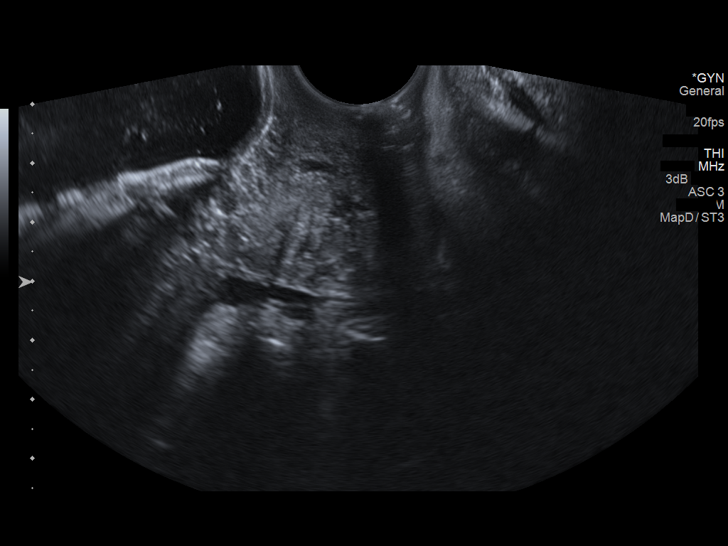
[im 79/79]
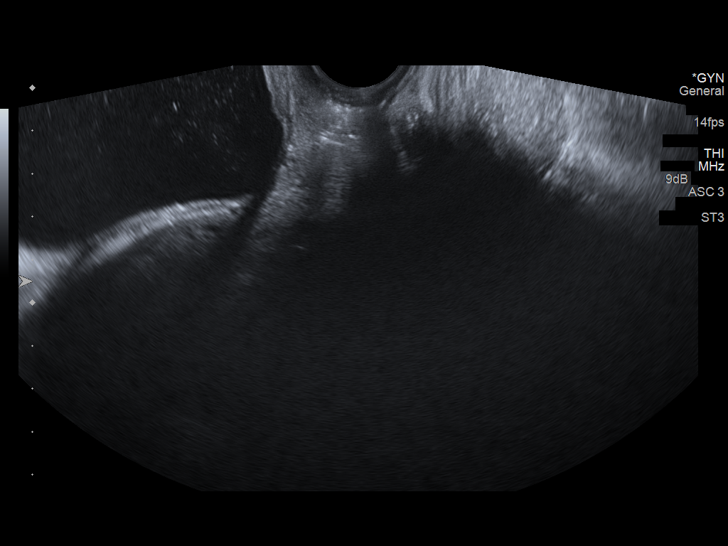

[13 of 25 positions shown; findings below may reference images not displayed]

FINDINGS: Uterus

Measurements: 8.5 cm x 4.2 cm x 5.2 cm. No fibroids or other mass
visualized.

Endometrium

Thickness: 10.4 mm.  No focal abnormality visualized.

Right ovary

Measurements: 4.6 cm x 2.9 cm x 2.9 cm. Complex appearing cysts
irregular margin measures 2.7 cm in greatest dimension. No adnexal
masses.

Left ovary

Measurements: 3.5 cm x 1.6 cm x 3.7 cm. Heterogeneous echogenicity
area adjacent to the left ovary with some increased blood flow. This
may be inflammatory. There is no defined abscess.

Other findings

Mild amount pelvic free fluid. Patient demonstrates significant pain
particularly on endovaginal imaging.
IMPRESSION: 1. Findings support pelvic inflammatory disease. There is moderate
amount pelvic free fluid. There is a heterogeneous echogenicity
focal area adjacent to the left ovary with increased blood flow
appears inflammatory. No defined tubo-ovarian abscess. No evidence
of a dilated fallopian tube.
2. Normal uterus. Dominant complex appearing cyst in the right ovary
likely involuting/partly ruptured follicular cyst or hemorrhagic
cyst.
3. Normal uterus.

## 2017-07-16 ENCOUNTER — Ambulatory Visit: Payer: Medicaid Other | Admitting: Obstetrics & Gynecology

## 2017-07-19 ENCOUNTER — Ambulatory Visit: Payer: Medicaid Other | Admitting: Obstetrics & Gynecology

## 2017-08-20 ENCOUNTER — Ambulatory Visit (INDEPENDENT_AMBULATORY_CARE_PROVIDER_SITE_OTHER): Payer: Medicaid Other | Admitting: Obstetrics and Gynecology

## 2017-08-20 ENCOUNTER — Encounter: Payer: Self-pay | Admitting: Obstetrics and Gynecology

## 2017-08-20 DIAGNOSIS — O3443 Maternal care for other abnormalities of cervix, third trimester: Secondary | ICD-10-CM

## 2017-08-20 DIAGNOSIS — R87619 Unspecified abnormal cytological findings in specimens from cervix uteri: Secondary | ICD-10-CM | POA: Diagnosis present

## 2017-08-20 NOTE — Progress Notes (Signed)
Heather Kim is referred from the [facility] for colpo Pap of 05/31/17 LGSIL susp HGSIL No prior H/O abnormal pap smears. Pt is 29 pregnant and is receiving her prenatal care at the [facility].  Procedure  Informed consent was obtained Procedure and indications reviewed  Pt placed in DL position Specelum placed Cervix visualized. TZ noted. HPV testing obtained. Cleaned with acetic acid [place] and glandular changes noted. No punctuation, atypical blood vessels or gross lesions noted. Procedure completed. Pt tolerated well.  Continue PNC with the [facility]. Follow up pap smear postpartum

## 2017-08-20 NOTE — Addendum Note (Signed)
Addended by: Clearnce Sorrel on: 08/20/2017 11:32 AM   Modules accepted: Orders

## 2017-08-26 ENCOUNTER — Telehealth: Payer: Self-pay | Admitting: General Practice

## 2017-08-26 ENCOUNTER — Encounter: Payer: Self-pay | Admitting: General Practice

## 2017-08-26 NOTE — Telephone Encounter (Signed)
Opened in error

## 2017-09-09 IMAGING — US US OB COMP LESS 14 WK
1 series · 13 of 26 positions shown · non-contrast
Comparison: None.

CLINICAL DATA: Pregnant. Syncope. Patient became dizzy around 2
p.m. today. Estimated gestational age by LMP is 6 weeks 2 days.
Quantitative beta HCG is greater than 1666.

EXAM:
OBSTETRIC <14 WK US AND TRANSVAGINAL OB US
TECHNIQUE: Both transabdominal and transvaginal ultrasound examinations were
performed for complete evaluation of the gestation as well as the
maternal uterus, adnexal regions, and pelvic cul-de-sac.
Transvaginal technique was performed to assess early pregnancy.

[Series 1: us ob comp less 14 wk · 0.25mm/px · 13 of 26 slices shown]
[im 2/26]
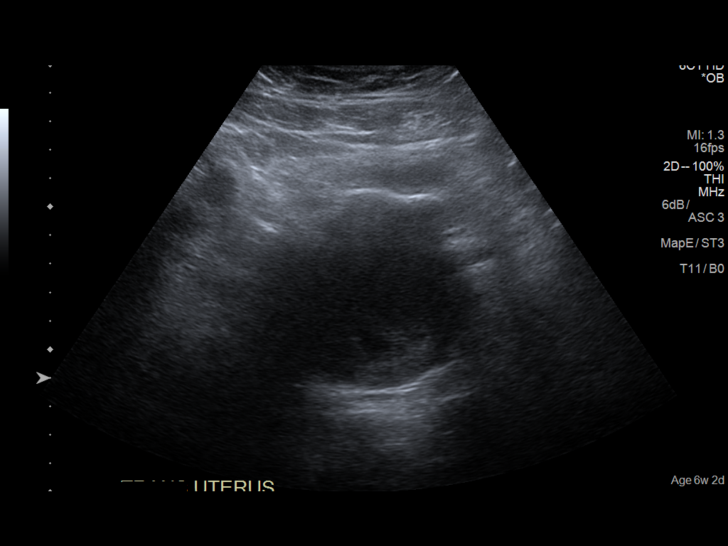
[im 4/26]
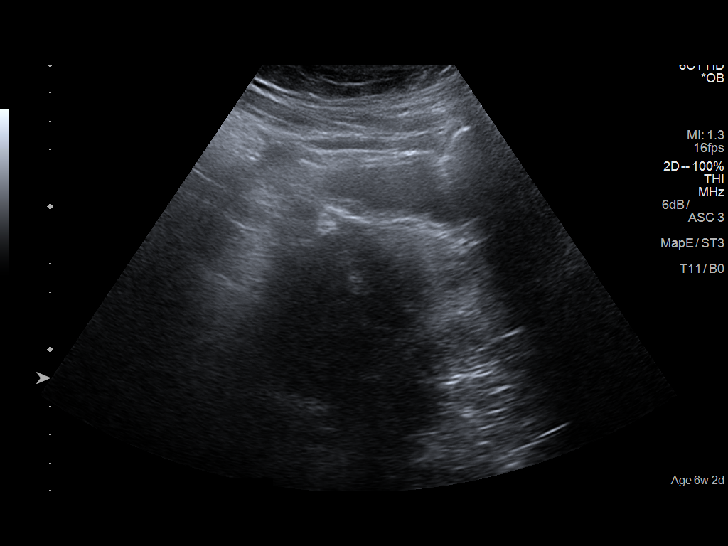
[im 6/26]
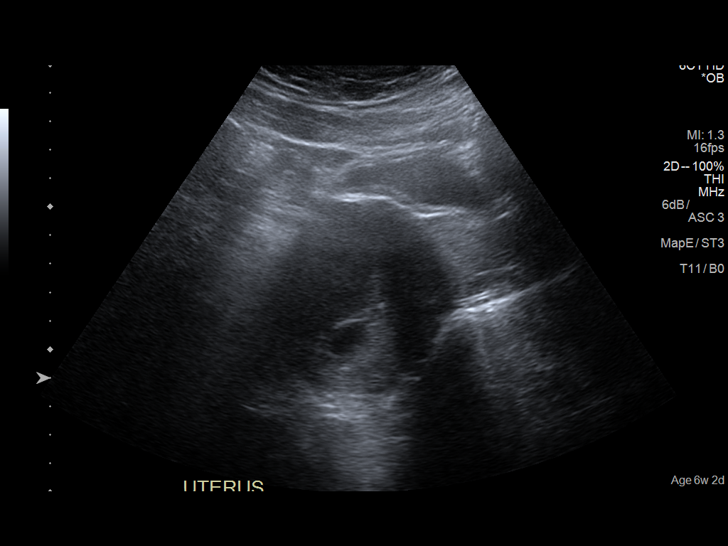
[im 8/26]
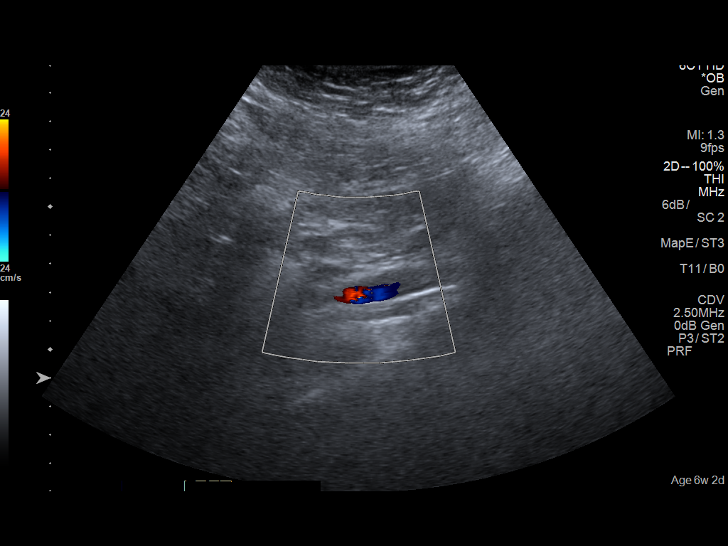
[im 10/26]
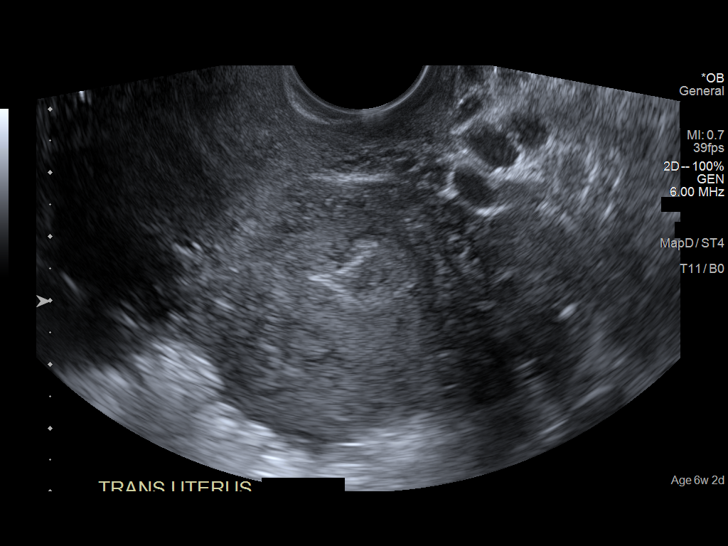
[im 12/26]
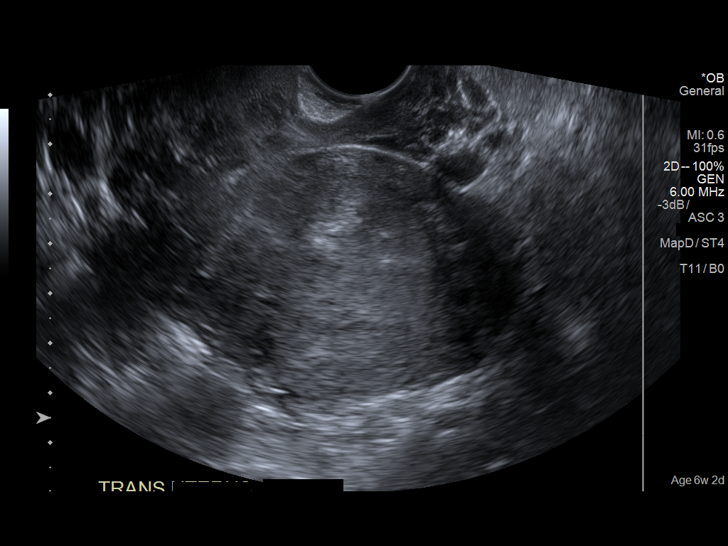
[im 14/26]
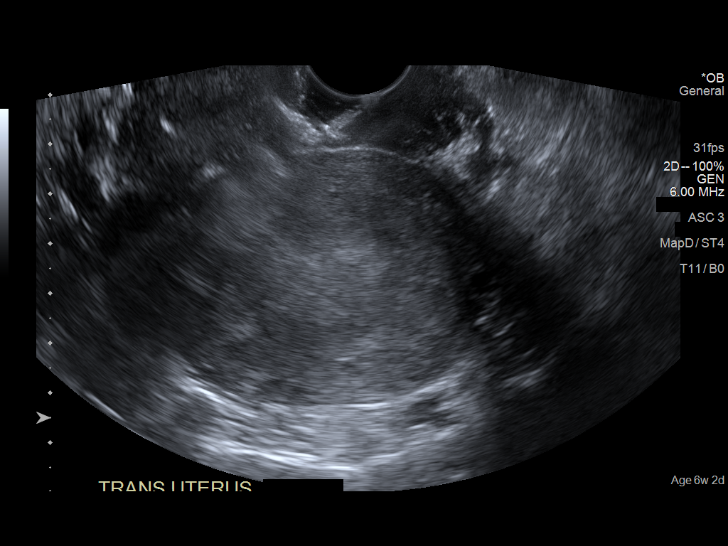
[im 16/26]
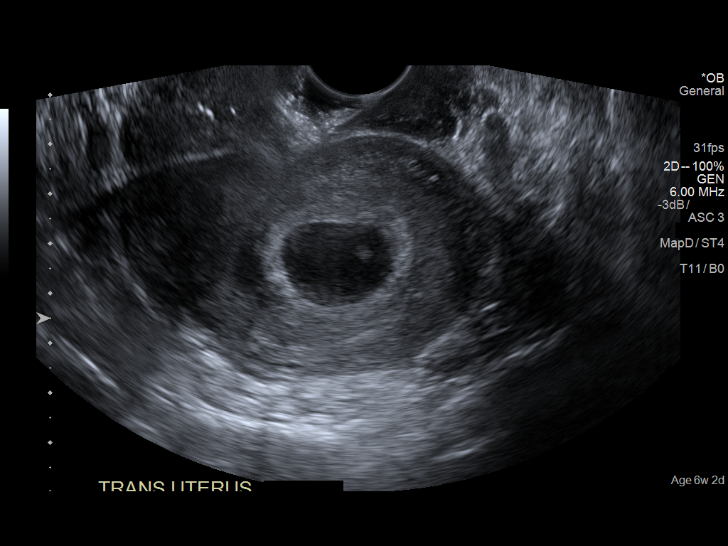
[im 18/26]
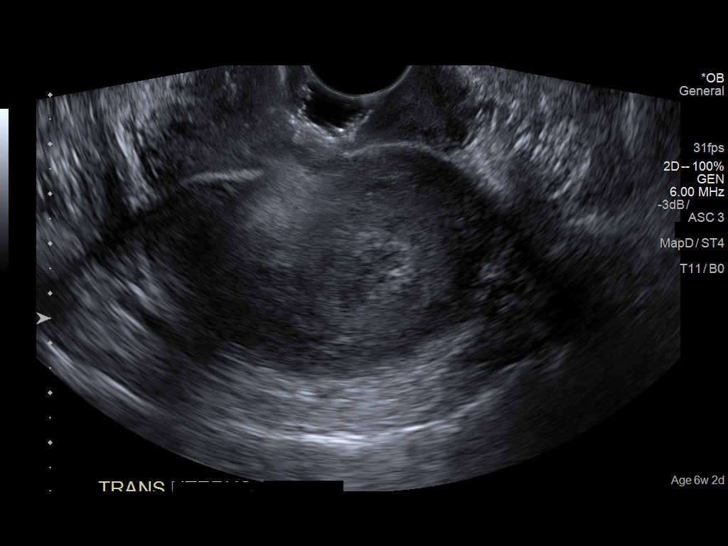
[im 20/26]
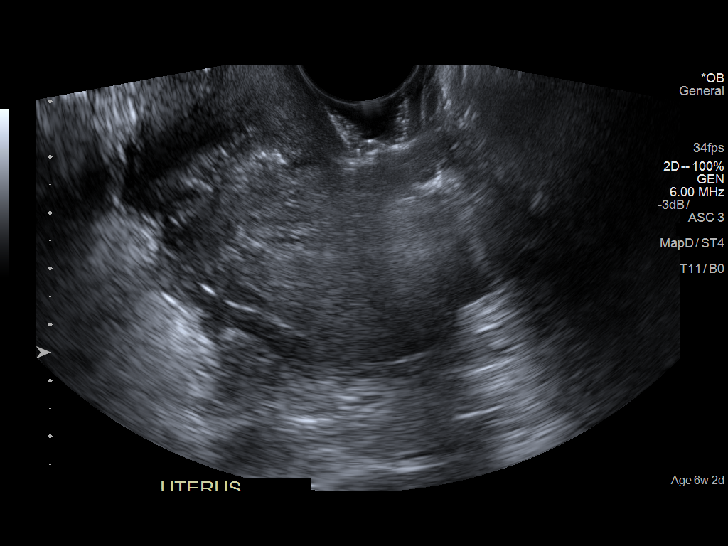
[im 22/26]
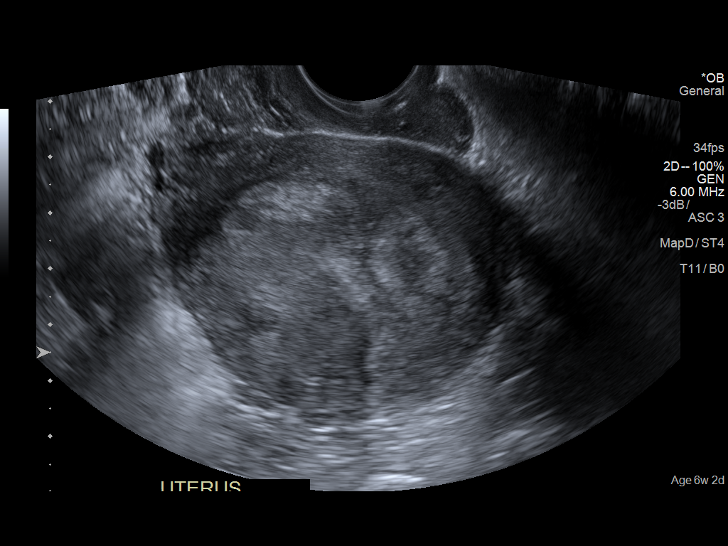
[im 24/26]
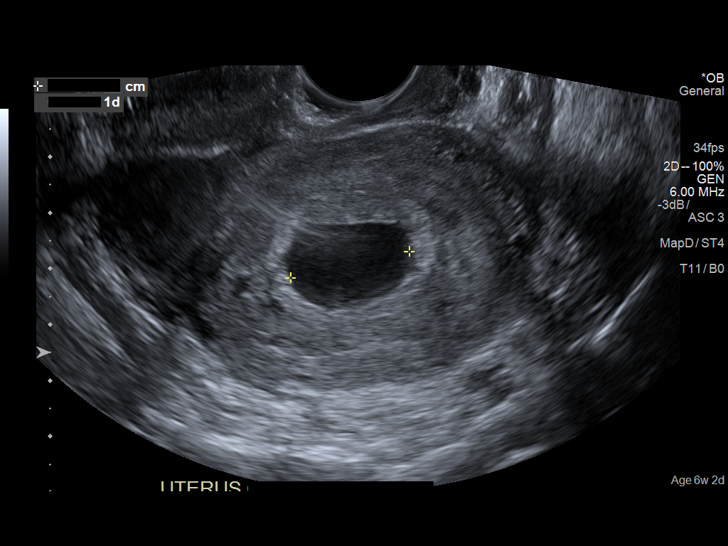
[im 26/26]
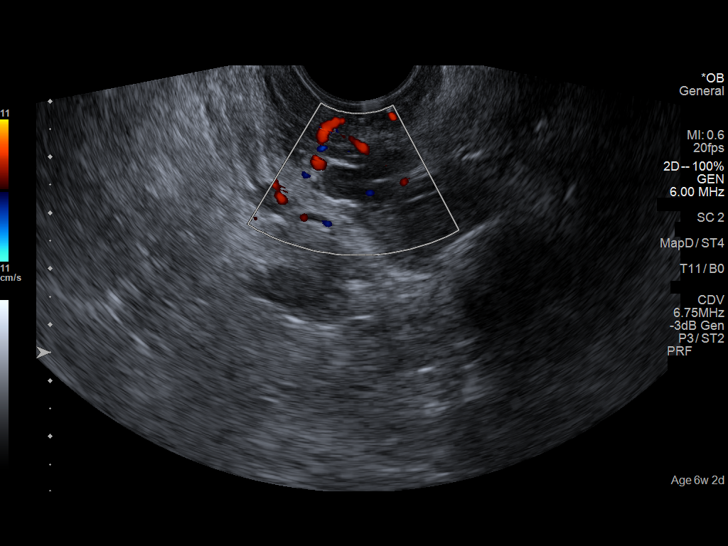

[13 of 26 positions shown; findings below may reference images not displayed]

FINDINGS: Intrauterine gestational sac: A single intrauterine gestational sac
is present.

Yolk sac:  Yolk sac is present.

Embryo:  Fetal pole is not identified.

Cardiac Activity: Not identified.

MSD: 19  mm   6 w   6  d                US EDC: 05/29/2016

Maternal uterus/adnexae: Uterus is retroverted. No myometrial mass
lesions identified. No evidence of subchorionic hemorrhage. Limited
visualization of the cervix. Ovaries are not visualized. No abnormal
adnexal masses demonstrated on images of the adnexal regions
obtained. No free fluid in the pelvis.
IMPRESSION: Probable early intrauterine gestational sac with yolk sac, but no
fetal pole or cardiac activity yet visualized. Recommend follow-up
quantitative B-HCG levels and follow-up US in 14 days to confirm and
assess viability. This recommendation follows SRU consensus
guidelines: Diagnostic Criteria for Nonviable Pregnancy Early in the
First Trimester. N Engl J Med 3175; [DATE].

## 2017-10-04 LAB — OB RESULTS CONSOLE GBS: STREP GROUP B AG: POSITIVE

## 2017-10-20 ENCOUNTER — Inpatient Hospital Stay (HOSPITAL_COMMUNITY)
Admission: AD | Admit: 2017-10-20 | Discharge: 2017-10-20 | Disposition: A | Discharge status: Home / Self Care | Payer: Medicaid Other | Source: Ambulatory Visit | Attending: Family Medicine | Admitting: Family Medicine

## 2017-10-20 ENCOUNTER — Encounter (HOSPITAL_COMMUNITY): Payer: Self-pay

## 2017-10-20 DIAGNOSIS — O471 False labor at or after 37 completed weeks of gestation: Secondary | ICD-10-CM

## 2017-10-20 NOTE — Discharge Instructions (Signed)

## 2017-10-20 NOTE — MAU Note (Signed)
Contractions started 4 hours ago. Pt state 3 mins apart. Denies loss of fluid or bleeding. Some decreased FM since cntrx started.

## 2017-10-21 ENCOUNTER — Inpatient Hospital Stay (HOSPITAL_COMMUNITY): Payer: Medicaid Other | Admitting: Anesthesiology

## 2017-10-21 ENCOUNTER — Inpatient Hospital Stay (HOSPITAL_COMMUNITY)
Admission: AD | Admit: 2017-10-21 | Discharge: 2017-10-23 | DRG: 806 | Disposition: A | Discharge status: Home / Self Care | Payer: Medicaid Other | Source: Ambulatory Visit | Attending: Obstetrics & Gynecology | Admitting: Obstetrics & Gynecology

## 2017-10-21 ENCOUNTER — Encounter (HOSPITAL_COMMUNITY): Payer: Self-pay | Admitting: *Deleted

## 2017-10-21 DIAGNOSIS — D649 Anemia, unspecified: Secondary | ICD-10-CM | POA: Diagnosis present

## 2017-10-21 DIAGNOSIS — Z609 Problem related to social environment, unspecified: Secondary | ICD-10-CM

## 2017-10-21 DIAGNOSIS — F1290 Cannabis use, unspecified, uncomplicated: Secondary | ICD-10-CM | POA: Diagnosis present

## 2017-10-21 DIAGNOSIS — E669 Obesity, unspecified: Secondary | ICD-10-CM | POA: Diagnosis present

## 2017-10-21 DIAGNOSIS — Z3A38 38 weeks gestation of pregnancy: Secondary | ICD-10-CM | POA: Diagnosis not present

## 2017-10-21 DIAGNOSIS — O9902 Anemia complicating childbirth: Principal | ICD-10-CM | POA: Diagnosis present

## 2017-10-21 DIAGNOSIS — O99324 Drug use complicating childbirth: Secondary | ICD-10-CM | POA: Diagnosis present

## 2017-10-21 DIAGNOSIS — O99824 Streptococcus B carrier state complicating childbirth: Secondary | ICD-10-CM | POA: Diagnosis present

## 2017-10-21 DIAGNOSIS — O26893 Other specified pregnancy related conditions, third trimester: Secondary | ICD-10-CM | POA: Diagnosis present

## 2017-10-21 DIAGNOSIS — Z659 Problem related to unspecified psychosocial circumstances: Secondary | ICD-10-CM

## 2017-10-21 DIAGNOSIS — O99214 Obesity complicating childbirth: Secondary | ICD-10-CM | POA: Diagnosis present

## 2017-10-21 LAB — CBC
HEMATOCRIT: 30 % — AB (ref 36.0–46.0)
HEMOGLOBIN: 9.9 g/dL — AB (ref 12.0–15.0)
MCH: 27.2 pg (ref 26.0–34.0)
MCHC: 33 g/dL (ref 30.0–36.0)
MCV: 82.4 fL (ref 78.0–100.0)
Platelets: 168 10*3/uL (ref 150–400)
RBC: 3.64 MIL/uL — ABNORMAL LOW (ref 3.87–5.11)
RDW: 13.4 % (ref 11.5–15.5)
WBC: 7.8 10*3/uL (ref 4.0–10.5)

## 2017-10-21 LAB — TYPE AND SCREEN
ABO/RH(D): O POS
Antibody Screen: NEGATIVE

## 2017-10-21 MED ORDER — METHYLERGONOVINE MALEATE 0.2 MG/ML IJ SOLN
0.2000 mg | INTRAMUSCULAR | Status: DC | PRN
Start: 2017-10-21 — End: 2017-10-23

## 2017-10-21 MED ORDER — BENZOCAINE-MENTHOL 20-0.5 % EX AERO: 1.0000 "application " | INHALATION_SPRAY | CUTANEOUS | Status: DC | PRN

## 2017-10-21 MED ORDER — SOD CITRATE-CITRIC ACID 500-334 MG/5ML PO SOLN: 30.0000 mL | ORAL | Status: DC | PRN

## 2017-10-21 MED ORDER — MISOPROSTOL 200 MCG PO TABS
ORAL_TABLET | ORAL | Status: AC
  Filled 2017-10-21: qty 5

## 2017-10-21 MED ORDER — OXYCODONE-ACETAMINOPHEN 5-325 MG PO TABS: 2.0000 | ORAL_TABLET | ORAL | Status: DC | PRN

## 2017-10-21 MED ORDER — LACTATED RINGERS IV SOLN: 500.0000 mL | INTRAVENOUS | Status: DC | PRN

## 2017-10-21 MED ORDER — ZOLPIDEM TARTRATE 5 MG PO TABS: 5.0000 mg | ORAL_TABLET | Freq: Every evening | ORAL | Status: DC | PRN

## 2017-10-21 MED ORDER — IBUPROFEN 600 MG PO TABS
600.0000 mg | ORAL_TABLET | Freq: Four times a day (QID) | ORAL | Status: DC
  Administered 2017-10-22 – 2017-10-23 (×7): 600 mg via ORAL
  Filled 2017-10-21 (×7): qty 1

## 2017-10-21 MED ORDER — METHYLERGONOVINE MALEATE 0.2 MG PO TABS
0.2000 mg | ORAL_TABLET | ORAL | Status: DC | PRN
Start: 2017-10-21 — End: 2017-10-23

## 2017-10-21 MED ORDER — WITCH HAZEL-GLYCERIN EX PADS: 1.0000 "application " | MEDICATED_PAD | CUTANEOUS | Status: DC | PRN

## 2017-10-21 MED ORDER — EPHEDRINE 5 MG/ML INJ
10.0000 mg | INTRAVENOUS | Status: DC | PRN
  Filled 2017-10-21: qty 2

## 2017-10-21 MED ORDER — OXYTOCIN BOLUS FROM INFUSION
500.0000 mL | Freq: Once | INTRAVENOUS | Status: AC
  Administered 2017-10-21: 500 mL via INTRAVENOUS

## 2017-10-21 MED ORDER — LIDOCAINE HCL (PF) 1 % IJ SOLN
30.0000 mL | INTRAMUSCULAR | Status: DC | PRN
  Filled 2017-10-21: qty 30

## 2017-10-21 MED ORDER — DIBUCAINE 1 % RE OINT: 1.0000 "application " | TOPICAL_OINTMENT | RECTAL | Status: DC | PRN

## 2017-10-21 MED ORDER — PENICILLIN G POTASSIUM 5000000 UNITS IJ SOLR
5.0000 10*6.[IU] | Freq: Once | INTRAVENOUS | Status: AC
  Administered 2017-10-21: 5 10*6.[IU] via INTRAVENOUS
  Filled 2017-10-21: qty 5

## 2017-10-21 MED ORDER — LIDOCAINE HCL (PF) 1 % IJ SOLN
INTRAMUSCULAR | Status: DC | PRN
  Administered 2017-10-21: 3 mL via EPIDURAL
  Administered 2017-10-21: 5 mL via EPIDURAL
  Administered 2017-10-21: 2 mL via EPIDURAL

## 2017-10-21 MED ORDER — ONDANSETRON HCL 4 MG/2ML IJ SOLN: 4.0000 mg | INTRAMUSCULAR | Status: DC | PRN

## 2017-10-21 MED ORDER — LACTATED RINGERS IV SOLN: 500.0000 mL | Freq: Once | INTRAVENOUS | Status: DC

## 2017-10-21 MED ORDER — TETANUS-DIPHTH-ACELL PERTUSSIS 5-2.5-18.5 LF-MCG/0.5 IM SUSP: 0.5000 mL | Freq: Once | INTRAMUSCULAR | Status: DC

## 2017-10-21 MED ORDER — COCONUT OIL OIL
1.0000 | TOPICAL_OIL | Status: DC | PRN
Start: 2017-10-21 — End: 2017-10-23

## 2017-10-21 MED ORDER — OXYCODONE-ACETAMINOPHEN 5-325 MG PO TABS: 1.0000 | ORAL_TABLET | ORAL | Status: DC | PRN

## 2017-10-21 MED ORDER — FENTANYL 2.5 MCG/ML BUPIVACAINE 1/10 % EPIDURAL INFUSION (WH - ANES)
14.0000 mL/h | INTRAMUSCULAR | Status: DC | PRN
  Administered 2017-10-21: 14 mL/h via EPIDURAL
  Filled 2017-10-21: qty 100

## 2017-10-21 MED ORDER — PRENATAL MULTIVITAMIN CH
1.0000 | ORAL_TABLET | Freq: Every day | ORAL | Status: DC
  Administered 2017-10-22 – 2017-10-23 (×2): 1 via ORAL
  Filled 2017-10-21 (×2): qty 1

## 2017-10-21 MED ORDER — SIMETHICONE 80 MG PO CHEW: 80.0000 mg | CHEWABLE_TABLET | ORAL | Status: DC | PRN

## 2017-10-21 MED ORDER — OXYTOCIN 40 UNITS IN LACTATED RINGERS INFUSION - SIMPLE MED
2.5000 [IU]/h | INTRAVENOUS | Status: DC
  Filled 2017-10-21: qty 1000

## 2017-10-21 MED ORDER — ONDANSETRON HCL 4 MG PO TABS: 4.0000 mg | ORAL_TABLET | ORAL | Status: DC | PRN

## 2017-10-21 MED ORDER — FENTANYL CITRATE (PF) 100 MCG/2ML IJ SOLN
100.0000 ug | INTRAMUSCULAR | Status: DC | PRN
  Administered 2017-10-21: 100 ug via INTRAVENOUS
  Filled 2017-10-21: qty 2

## 2017-10-21 MED ORDER — DIPHENHYDRAMINE HCL 25 MG PO CAPS: 25.0000 mg | ORAL_CAPSULE | Freq: Four times a day (QID) | ORAL | Status: DC | PRN

## 2017-10-21 MED ORDER — PHENYLEPHRINE 40 MCG/ML (10ML) SYRINGE FOR IV PUSH (FOR BLOOD PRESSURE SUPPORT)
80.0000 ug | PREFILLED_SYRINGE | INTRAVENOUS | Status: DC | PRN
  Filled 2017-10-21: qty 5

## 2017-10-21 MED ORDER — ACETAMINOPHEN 325 MG PO TABS
650.0000 mg | ORAL_TABLET | ORAL | Status: DC | PRN
  Administered 2017-10-21 – 2017-10-23 (×2): 650 mg via ORAL
  Filled 2017-10-21 (×2): qty 2

## 2017-10-21 MED ORDER — ACETAMINOPHEN 325 MG PO TABS: 650.0000 mg | ORAL_TABLET | ORAL | Status: DC | PRN

## 2017-10-21 MED ORDER — PENICILLIN G POT IN DEXTROSE 60000 UNIT/ML IV SOLN
3.0000 10*6.[IU] | INTRAVENOUS | Status: DC
  Administered 2017-10-21: 3 10*6.[IU] via INTRAVENOUS
  Filled 2017-10-21 (×3): qty 50

## 2017-10-21 MED ORDER — PHENYLEPHRINE 40 MCG/ML (10ML) SYRINGE FOR IV PUSH (FOR BLOOD PRESSURE SUPPORT)
80.0000 ug | PREFILLED_SYRINGE | INTRAVENOUS | Status: DC | PRN
  Filled 2017-10-21: qty 5
  Filled 2017-10-21: qty 10

## 2017-10-21 MED ORDER — SENNOSIDES-DOCUSATE SODIUM 8.6-50 MG PO TABS
2.0000 | ORAL_TABLET | ORAL | Status: DC
  Administered 2017-10-22 – 2017-10-23 (×2): 2 via ORAL
  Filled 2017-10-21 (×2): qty 2

## 2017-10-21 MED ORDER — DIPHENHYDRAMINE HCL 50 MG/ML IJ SOLN: 12.5000 mg | INTRAMUSCULAR | Status: DC | PRN

## 2017-10-21 MED ORDER — OXYTOCIN 10 UNIT/ML IJ SOLN
10.0000 [IU] | Freq: Once | INTRAMUSCULAR | Status: DC | PRN
  Filled 2017-10-21: qty 1

## 2017-10-21 MED ORDER — LACTATED RINGERS IV SOLN
INTRAVENOUS | Status: DC
  Administered 2017-10-21: 12:00:00 via INTRAVENOUS

## 2017-10-21 NOTE — Anesthesia Pain Management Evaluation Note (Signed)
  CRNA Pain Management Visit Note  Patient: Heather Kim, 31 y.o., female  "Hello I am a member of the anesthesia team at [facility]. We have an anesthesia team available at all times to provide care throughout the hospital, including epidural management and anesthesia for C-section. I don't know your plan for the delivery whether it a natural birth, water birth, IV sedation, nitrous supplementation, doula or epidural, but we want to meet your pain goals."   1.Was your pain managed to your expectations on prior hospitalizations?   Yes   2.What is your expectation for pain management during this hospitalization?     Epidural  3.How can we help you reach that goal? The patient stated that she wanted an epidural but her RN suggested that she wait and try IV pain medication first.  She states that she is a little more comfortable but wants the epidural now! The RN was informed of her decision.   Record the patient's initial score and the patient's pain goal.   Pain: 8  Pain Goal: 10 [facility] wants you to be able to say your pain was always managed very well.  Brittanni Cariker 10/21/2017

## 2017-10-21 NOTE — Anesthesia Procedure Notes (Signed)
Epidural Patient location during procedure: OB Start time: 10/21/2017 3:38 PM End time: 10/21/2017 3:42 PM  Staffing Anesthesiologist: Cecile HearingURK, Pauline Pegues EDWARD Performed: anesthesiologist   Preanesthetic Checklist Completed: patient identified, pre-op evaluation, timeout performed, IV checked, risks and benefits discussed and monitors and equipment checked  Epidural Patient position: sitting Prep: DuraPrep Patient monitoring: blood pressure and continuous pulse ox Approach: midline Location: L3-L4 Injection technique: LOR air  Needle:  Needle type: Tuohy  Needle gauge: 17 G Needle length: 9 cm Needle insertion depth: 5 cm Catheter size: 19 Gauge Catheter at skin depth: 10 cm Test dose: negative and Other (1% Lidocaine)  Additional Notes Patient identified.  Risk benefits discussed including failed block, incomplete pain control, headache, nerve damage, paralysis, blood pressure changes, nausea, vomiting, reactions to medication both toxic or allergic, and postpartum back pain.  Patient expressed understanding and wished to proceed.  All questions were answered.  Sterile technique used throughout procedure and epidural site dressed with sterile barrier dressing. No paresthesia or other complications noted. The patient did not experience any signs of intravascular injection such as tinnitus or metallic taste in mouth nor signs of intrathecal spread such as rapid motor block. Please see nursing notes for vital signs. Reason for block:procedure for pain

## 2017-10-21 NOTE — H&P (Signed)
LABOR AND DELIVERY ADMISSION HISTORY AND PHYSICAL NOTE  Heather Kim is a 31 y.o. female [phone] with IUP at 8354w3d by LMP and confirmed by US presenting for Spontaneous labor. MAU reported that pt had progress to 4.5 dil. She reports positive fetal movement. She denies leakage of fluid or vaginal bleeding.  [facility]Com[facility] Pregnancy complications:  none  Past Medical History: Past Medical History:  Diagnosis Date  . Migraine headache     Past Surgical History: Past Surgical History:  Procedure Laterality Date  . NO PAST SURGERIES      Obstetrical History: OB History    Gravida Para Term Preterm AB Living   5 3 3   1 3    SAB TAB Ectopic Multiple Live Births   1     0 3      Social History: Social History   Social History  . Marital status: Single    Spouse name: N/A  . Number of children: N/A  . Years of education: N/A   Social History Main Topics  . Smoking status: Never Smoker  . Smokeless tobacco: Never Used  . Alcohol use No  . Drug use: No     Comment: past - last use September 2016  . Sexual activity: Yes   Other Topics Concern  . None   Social History Narrative  . None    Family History: Family History  Problem Relation Age of Onset  . Deep vein thrombosis Mother   . Cancer Father     Allergies: No Known Allergies  Prescriptions Prior to Admission  Medication Sig Dispense Refill Last Dose  . Prenatal Vit-Fe Fumarate-FA (PRENATAL MULTIVITAMIN) TABS tablet Take 1 tablet by mouth daily at 12 noon.   10/21/2017 at Unknown time  . acetaminophen (TYLENOL) 325 MG tablet Take 2 tablets (650 mg total) by mouth every 4 (four) hours as needed (for pain scale < 4). 20 tablet 0 prn     Review of Systems  All systems reviewed and negative except as stated in HPI  Physical Exam Blood pressure (!) 99/52, pulse 65, temperature (!) 97.2 F (36.2 C), temperature source Oral, resp. rate 18, height 5\' 4"  (1.626 m),  weight 85.3 kg (188 lb), SpO2 100 %, not currently breastfeeding. General appearance: alert, cooperative and no distress Lungs: clear to auscultation bilaterally Heart: regular rate and rhythm Abdomen: soft, non-tender; bowel sounds normal Extremities: No calf swelling or tenderness Presentation: cephalic  Fetal monitoring: 130 baseline/moderate variability/+ accels, no decels Uterine activity: uterine irritability, no ctx detected.  Dilation: 4.5 Effacement (%): 50 Station: -3 Exam by:: Janeth Rasehristina Robinson RNC  Prenatal labs: ABO, Rh: --/--/O POS (10/25 1157) Antibody: NEG (10/25 1157) Rubella: Immune (06/04 0000) RPR: Nonreactive (06/04 0000)  HBsAg: Negative (06/04 0000)  HIV: Non-reactive (06/04 0000)  GC/Chlamydia: Neg (05/31/17) GBS: Positive (10/08 0000)  1 hr Glucola: 103 (03/17/16) Genetic screening: Decline Anatomy US: Normal  Prenatal Transfer Tool  Maternal Diabetes: No Genetic Screening: Declined Maternal Ultrasounds/Referrals: Normal Fetal Ultrasounds or other Referrals:  None Maternal Substance Abuse:  Yes:  Type: Marijuana Significant Maternal Medications:  None Significant Maternal Lab Results: None  Results for orders placed or performed during the hospital encounter of 10/21/17 (from the past 24 hour(s))  CBC   Collection Time: 10/21/17 11:57 AM  Result Value Ref Range   WBC 7.8 4.0 - 10.5 K/uL   RBC 3.64 (L) 3.87 - 5.11 MIL/uL   Hemoglobin 9.9 (L) 12.0 - 15.0 g/dL  HCT 30.0 (L) 36.0 - 46.0 %   MCV 82.4 78.0 - 100.0 fL   MCH 27.2 26.0 - 34.0 pg   MCHC 33.0 30.0 - 36.0 g/dL   RDW 16.1 09.6 - 04.5 %   Platelets 168 150 - 400 K/uL  Type and screen [facility]   Collection Time: 10/21/17 11:57 AM  Result Value Ref Range   ABO/RH(D) O POS    Antibody Screen NEG    Sample Expiration 10/24/2017     Patient Active Problem List   Diagnosis Date Noted  . Indication for care in labor or delivery 10/21/2017  . Abnormal cervical  Papanicolaou smear affecting pregnancy in third trimester 08/20/2017  . Marijuana use 12/21/2015  . Supervision of normal pregnancy 12/11/2015    Assessment: Heather Kim is a 31 y.o. [id number] at [redacted]w[redacted]d presenting from MAU for spontaneous labor.   #Labor: progressing well #Pain: Epidural #FWB: Cat 1 #ID:  GBS neg #MOF: Bottle #MOC:Tubal ligation #Circ: N/A (Girl)  Steffanie Rainwater 10/21/2017, 2:29 PM

## 2017-10-21 NOTE — MAU Note (Signed)
+  contractions Ongoing since last Thursday; but got worse last night  States lost her mucous plug  Denies vaginal bleeding or LOF.  +FM

## 2017-10-21 NOTE — Anesthesia Preprocedure Evaluation (Signed)
Anesthesia Evaluation  Patient identified by MRN, date of birth, ID band Patient awake    Reviewed: Allergy & Precautions, NPO status , Patient's Chart, lab work & pertinent test results  Airway Mallampati: II  TM Distance: >3 FB Neck ROM: Full    Dental  (+) Teeth Intact, Dental Advisory Given   Pulmonary neg pulmonary ROS,    Pulmonary exam normal breath sounds clear to auscultation       Cardiovascular negative cardio ROS Normal cardiovascular exam Rhythm:Regular Rate:Normal     Neuro/Psych  Headaches,    GI/Hepatic negative GI ROS, Neg liver ROS,   Endo/Other  Obesity   Renal/GU negative Renal ROS     Musculoskeletal negative musculoskeletal ROS (+)   Abdominal   Peds  Hematology  (+) Blood dyscrasia, anemia , Plt 168k   Anesthesia Other Findings Day of surgery medications reviewed with the patient.  Reproductive/Obstetrics (+) Pregnancy                             Anesthesia Physical Anesthesia Plan  ASA: II  Anesthesia Plan: Epidural   Post-op Pain Management:    Induction:   PONV Risk Score and Plan: Treatment may vary due to age or medical condition  Airway Management Planned:   Additional Equipment:   Intra-op Plan:   Post-operative Plan:   Informed Consent: I have reviewed the patients History and Physical, chart, labs and discussed the procedure including the risks, benefits and alternatives for the proposed anesthesia with the patient or authorized representative who has indicated his/her understanding and acceptance.   Dental advisory given  Plan Discussed with:   Anesthesia Plan Comments: (Patient identified. Risks/Benefits/Options discussed with patient including but not limited to bleeding, infection, nerve damage, paralysis, failed block, incomplete pain control, headache, blood pressure changes, nausea, vomiting, reactions to medication both or allergic,  itching and postpartum back pain. Confirmed with bedside nurse the patient's most recent platelet count. Confirmed with patient that they are not currently taking any anticoagulation, have any bleeding history or any family history of bleeding disorders. Patient expressed understanding and wished to proceed. All questions were answered. )        Anesthesia Quick Evaluation

## 2017-10-22 LAB — RPR: RPR: NONREACTIVE

## 2017-10-22 LAB — CBC
HCT: 26.7 % — ABNORMAL LOW (ref 36.0–46.0)
Hemoglobin: 9.2 g/dL — ABNORMAL LOW (ref 12.0–15.0)
MCH: 28.2 pg (ref 26.0–34.0)
MCHC: 34.5 g/dL (ref 30.0–36.0)
MCV: 81.9 fL (ref 78.0–100.0)
Platelets: 176 10*3/uL (ref 150–400)
RBC: 3.26 MIL/uL — ABNORMAL LOW (ref 3.87–5.11)
RDW: 13.4 % (ref 11.5–15.5)
WBC: 8.6 10*3/uL (ref 4.0–10.5)

## 2017-10-22 MED ORDER — OXYCODONE-ACETAMINOPHEN 5-325 MG PO TABS
1.0000 | ORAL_TABLET | ORAL | Status: DC | PRN
  Administered 2017-10-22 (×2): 1 via ORAL
  Filled 2017-10-22 (×2): qty 1

## 2017-10-22 MED ORDER — OXYCODONE-ACETAMINOPHEN 5-325 MG PO TABS
1.0000 | ORAL_TABLET | Freq: Once | ORAL | Status: AC
  Administered 2017-10-22: 1 via ORAL
  Filled 2017-10-22: qty 1

## 2017-10-22 NOTE — Anesthesia Postprocedure Evaluation (Signed)
Anesthesia Post Note  Patient: Heather Kim  Procedure(s) Performed: AN AD HOC LABOR EPIDURAL     Patient location during evaluation: Mother Baby Anesthesia Type: Epidural Level of consciousness: awake Pain management: satisfactory to patient Vital Signs Assessment: post-procedure vital signs reviewed and stable Respiratory status: spontaneous breathing Cardiovascular status: stable Anesthetic complications: no    Last Vitals:  Vitals:   10/21/17 2202 10/22/17 0400  BP: 111/69 (!) 106/58  Pulse: 69 63  Resp: 18 16  Temp: 36.8 C 36.8 C  SpO2:      Last Pain:  Vitals:   10/22/17 0401  TempSrc:   PainSc: 9    Pain Goal: Patients Stated Pain Goal: 2 (10/21/17 1010)               Cephus ShellingBURGER,Alyra Patty

## 2017-10-22 NOTE — Progress Notes (Signed)
Post Partum Day 1  Subjective:  Ebbie LatusLaquanda L Mccutcheon is a 31 y.o. [id number] 3935w3d s/p SVD.  No acute events overnight.  Pt denies problems with ambulating, voiding or po intake.  She denies nausea or vomiting.  Pain is well controlled. Lochia Small.  Plan for birth control is bilateral tubal ligation.  Method of Feeding: Breast and Bottle.   Objective: BP (!) 102/59 (BP Location: Right Arm)   Pulse 85   Temp 98.2 F (36.8 C) (Oral)   Resp 18   Ht 5\' 4"  (1.626 m)   Wt 85.3 kg (188 lb)   SpO2 100%   Breastfeeding? Unknown   BMI 32.27 kg/m   Physical Exam:  General: alert, cooperative and no distress Lochia:normal flow Chest: normal work of breathing Heart: regular rate Abdomen: soft, nontender Uterine Fundus: firm DVT Evaluation: No evidence of DVT seen on physical exam.   Recent Labs  10/21/17 1157 10/22/17 0459  HGB 9.9* 9.2*  HCT 30.0* 26.7*    Assessment/Plan:  ASSESSMENT: Ebbie LatusLaquanda L Garza is a 31 y.o. [id number] 5835w3d ppd #1 s/p NSVD doing well.   Plan for discharge tomorrow, Breastfeeding, Lactation consult and Contraception interval BTL Continue routine PP care Hbg stable s/p PPH   LOS: 1 day   Caryl AdaJazma Phelps, DO 10/22/2017, 9:19 AM

## 2017-10-22 NOTE — Lactation Note (Signed)
This note was copied from a baby's chart. Lactation Consultation Note: Mother reports that she breastfed her first two children for 6 months and her last child for 3 months. Infant is 6119 hours old and has had 5 feedings at the breast and 2 bottles. Mother reports that she is seeing colostrum when hand expresses. She reports that infant is feeding well. Mother denies having any discomfort with latching. Mother was given lactation brochure and informed of all LC services. Mother is active with WIC. Mother advised to page for assistance as needed.   Patient Name: Heather Kim'UToday's Date: 10/22/2017 Reason for consult: Initial assessment   Maternal Data Has patient been taught Hand Expression?: Yes (mother reports that she knows how to hand express) Does the patient have breastfeeding experience prior to this delivery?: Yes  Feeding Feeding Type: Breast Fed Length of feed: 30 min  LATCH Score Latch: Repeated attempts needed to sustain latch, nipple held in mouth throughout feeding, stimulation needed to elicit sucking reflex.  Audible Swallowing: Spontaneous and intermittent  Type of Nipple: Everted at rest and after stimulation  Comfort (Breast/Nipple): Soft / non-tender  Hold (Positioning): No assistance needed to correctly position infant at breast.  LATCH Score: 9  Interventions Interventions: Breast feeding basics reviewed;Skin to skin;Hand express  Lactation Tools Discussed/Used     Consult Status Consult Status: Follow-up Date: 10/22/17 Follow-up type: In-patient    Stevan BornKendrick, Marijose [facility] 10/22/2017, 1:12 PM

## 2017-10-22 NOTE — Clinical Social Work Maternal (Signed)
CLINICAL SOCIAL WORK MATERNAL/CHILD NOTE  Patient Details  Name: Heather Kim MRN: [id number] Date of Birth: 06/08/1986  Date:  2017/12/07  Clinical Social Worker Initiating Note:  Terri Piedra, [place] Date/Time: Initiated:  10/22/17/1330     Child's Name:  Heather Kim   Biological Parents:  Mother, Father Mariann Barter and Christel Mormon)   Need for Interpreter:  None   Reason for Referral:  Behavioral Health Concerns, Current Substance Use/Substance Use During Pregnancy    Address:  [address] [place] [address]    Phone number:  [phone] (home)     Additional phone number: MOB states she does not have a working phone.  She states she has a good relationship with her neighbors and can use one of their phones in case of an emergency.  CSW suggests she apply for an [employer] phone in case of emergencies, especially with a newborn in the home.  CSW provided information and MOB agrees.  She states she has access to the internet.  Household Members/Support Persons (HM/SP):   Household Member/Support Person 1, Household Member/Support Person 2, Household Member/Support Person 3   HM/SP Name Relationship DOB or Age  HM/SP -1   daughter 14  HM/SP -2 son 4 [place]  HM/SP -3 son 05/31/16    HM/SP -4        HM/SP -5        HM/SP -6        HM/SP -7        HM/SP -8          Natural Supports (not living in the home):  Extended Family, Immediate Family (MOB reports that FOB, her twin sister, and her aunt are her greatest support people.)   Professional Supports: None (MOB states she has never had mental health treatment and is interested at this time.)   Employment:     Type of Work:     Education:      Homebound arranged:    Museum/gallery curator Resources:  Medicaid   Other Resources:  [employer], Physicist, medical    Cultural/Religious Considerations Which May Impact Care: None stated.  Strengths:  Ability to meet basic needs , Home prepared for child     Psychotropic Medications:         Pediatrician:       Pediatrician List:   [facility]      Pediatrician Fax Number:    Risk Factors/Current Problems:  Mental Health Concerns , Substance Use    Cognitive State:  Able to Concentrate , Linear Thinking , Alert    Mood/Affect:  Calm , Flat , Interested    CSW Assessment: CSW met with MOB and FOB in room 126 to offer support and complete assessment due to marijuana use.  CSW notes hx of depression noted in MOB's chart.  When CSW arrived, MOB was attempting to latch baby for breast feeding and FOB was sitting on the couch.  CSW asked if this was a good time to talk with them and asked for consent to speak with FOB present, offering privacy if MOB prefers.  MOB states this is a good time to talk and states FOB can stay in the room.  CSW introduced services and inquired as to how MOB felt during pregnancy, labor and delivery.  FOB appeared irritated and angrily said, "is  there a problem?"  CSW asked why he would ask this question.  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  MOB seemed irritated by FOB's display and asked him to be quiet.  The remainder of the conversation was mostly between [facility] and CSW.  MOB was pleasant, although presented with a somewhat flat affect.  She was very receptive to CSW's visit, however, did not want to elaborate on some topics.   MOB states she was not planning this pregnancy and was not happy about it at first.  She states she is fine now and happy about her baby.  She feels she is bonding well.  She states she has everything she needs for baby at home and states awareness of SIDS as reviewed by CSW.  MOB states that her older two children, 8 and 4 seem excited about the baby,  but she is concerned that her baby (1.5) may have trouble adjusting.  CSW processed her feelings surrounding this and suggests that she allow time and patience for this transition as it brings emotions for all family members.  MOB states she and FOB are not in a relationship, but that he is involved and supportive.  She feels her sister and aunt are also good supports.  MOB reports that this pregnancy was "rough" because she "had a lot going on."  CSW allowed her space to share, but she did not want to elaborate on what she meant by this.  She states some things have resolved and some have not.  CSW asked how she feels she is coping emotionally at this time and she states she has been feeling somewhat depressed.  She reports hx of depression as well, and states she has never had treatment.  She was receptive to recommendation for counseling and accepting of resources for mental health follow up.  She seemed very appreciative.  She was engaged and attentive as CSW provided education regarding PMADs.   CSW inquired about marijuana use and MOB admits use to aid with nausea in pregnancy.  She states her last use was right before her last prenatal visit at approximately 36 weeks.  She is understanding of hospital drug screen policy and mandatory reporting, stating that she had CPS involvement when her son was born positive for [facility] last year.  She declines need for substance abuse treatment.  She has no questions. CSW will make referral to [facility] for additional support.  CSW Plan/Description:  No Further Intervention Required/No Barriers to Discharge, Sudden Infant Death Syndrome (SIDS) Education, Perinatal Mood and Anxiety Disorder (PMADs) Education, [place], Other Information/Referral to [employer], CSW Will Continue to Monitor Umbilical Cord Tissue Drug Screen Results and Make Report if Barnetta Chapel Jan 05, 2017, 8:19 PM

## 2017-10-23 DIAGNOSIS — Z659 Problem related to unspecified psychosocial circumstances: Secondary | ICD-10-CM

## 2017-10-23 MED ORDER — SENNOSIDES-DOCUSATE SODIUM 8.6-50 MG PO TABS: 2.0000 | ORAL_TABLET | ORAL | 0 refills | Status: DC

## 2017-10-23 MED ORDER — IBUPROFEN 600 MG PO TABS: 600.0000 mg | ORAL_TABLET | Freq: Four times a day (QID) | ORAL | 0 refills | Status: DC

## 2017-10-23 MED ORDER — OXYCODONE-ACETAMINOPHEN 5-325 MG PO TABS
1.0000 | ORAL_TABLET | ORAL | Status: DC | PRN
  Administered 2017-10-23: 2 via ORAL
  Filled 2017-10-23: qty 2

## 2017-10-23 NOTE — Progress Notes (Signed)
CSW acknowledges consult for edin[place] scale score. CSW Colleen Shaw already met with MOB and discussed PMAD's as well as provided resources for counseling support for depression hx.   Sigmond Patalano, MSW, LCSW-A Clinical Social Worker  [facility]  Office: [phone]  

## 2017-10-23 NOTE — Lactation Note (Signed)
This note was copied from a baby's chart. Lactation Consultation Note: Baby under phototherapy. Mom reports she just finished nursing for 10 min. Reports breasts are feeling fuller this morning and left breast feels softer now that she has nursed. Reviewed engorgement prevention and treatment. Has manual pump for home. Has been giving bottles of formula. Encouraged to always breast feed first now that her milk supply is increasing. No questions at present. To call prn  Patient Name: Heather Kim'UToday's Date: 10/23/2017 Reason for consult: Follow-up assessment   Maternal Data Formula Feeding for Exclusion: Yes Reason for exclusion: Mother's choice to formula feed on admision Does the patient have breastfeeding experience prior to this delivery?: Yes  Feeding Feeding Type: Breast Fed Length of feed: 10 min  LATCH Score                   Interventions    Lactation Tools Discussed/Used     Consult Status Consult Status: Complete    Pamelia HoitWeeks, Marylene Masek D 10/23/2017, 10:15 AM

## 2017-10-23 NOTE — Discharge Summary (Signed)
OB Discharge Summary     Patient Name: Heather Kim DOB: 11/10/1986 MRN: [id number]  Date of admission: 10/21/2017 Delivering MD: Sharrell KuAMPONSAH, PROSPER M   Date of discharge: 10/23/2017  Admitting diagnosis: LABOR Intrauterine pregnancy: 5566w3d     Secondary diagnosis:  Active Problems:   Indication for care in labor or delivery  Additional problems:  Abnormal cervical Papanicolaou smear affecting pregnancy in third trimester GBS Positive (treated with penicillin)   Discharge diagnosis: Term Pregnancy Delivered                                                                                                Post partum procedures:none  Augmentation: none  Complications: None  Hospital course:  Onset of Labor With Vaginal Delivery     31 y.o. yo [id number] at 7066w3d was admitted in Latent Labor on 10/21/2017. Patient had an uncomplicated labor course as follows:  Membrane Rupture Time/Date: 3:28 PM ,10/21/2017   Intrapartum Procedures: Episiotomy: None [1]                                         Lacerations:  1st degree [2];Perineal [11]  Patient had a delivery of a Viable infant. 10/21/2017  Information for the patient's newborn:  Heather Kim, Girl Heather Kim [[id number]][[id number]]  Delivery Method: Vag-Spont    Pateint had an uncomplicated postpartum course.  She is ambulating, tolerating a regular diet, passing flatus, and urinating well. Patient is discharged home in stable condition on 10/23/17.   Physical exam  Vitals:   10/22/17 0400 10/22/17 0905 10/22/17 1800 10/23/17 0617  BP: (!) 106/58 (!) 102/59 107/65 100/62  Pulse: 63 85 75 68  Resp: 16 18 18 18   Temp: 98.2 F (36.8 C) 98.2 F (36.8 C) 98.2 F (36.8 C) 97.8 F (36.6 C)  TempSrc:  Oral Oral Oral  SpO2:    99%  Weight:      Height:       General: alert, cooperative and no distress Lochia: appropriate Uterine Fundus: firm Incision: N/A DVT Evaluation: No evidence of DVT seen on physical exam. Labs: Lab Results   Component Value Date   WBC 8.6 10/22/2017   HGB 9.2 (L) 10/22/2017   HCT 26.7 (L) 10/22/2017   MCV 81.9 10/22/2017   PLT 176 10/22/2017   CMP Latest Ref Rng & Units 10/10/2015  Glucose 65 - 99 mg/dL 82  BUN 6 - 20 mg/dL 8  Creatinine 1.320.44 - 4.401.00 mg/dL 1.020.81  Sodium 725135 - 366145 mmol/L 138  Potassium 3.5 - 5.1 mmol/L 3.3(L)  Chloride 101 - 111 mmol/L 104  CO2 22 - 32 mmol/L 25  Calcium 8.9 - 10.3 mg/dL 9.7  Total Protein 6.5 - 8.1 g/dL -  Total Bilirubin 0.3 - 1.2 mg/dL -  Alkaline Phos 38 - 440126 U/L -  AST 15 - 41 U/L -  ALT 14 - 54 U/L -    Discharge instruction: per After Visit Summary and "Baby and Me Booklet".  After visit meds:  Allergies  as of 10/23/2017   No Known Allergies     Medication List    STOP taking these medications   acetaminophen 325 MG tablet Commonly known as:  TYLENOL     TAKE these medications   ibuprofen 600 MG tablet Commonly known as:  ADVIL,MOTRIN Take 1 tablet (600 mg total) by mouth every 6 (six) hours.   prenatal multivitamin Tabs tablet Take 1 tablet by mouth daily at 12 noon.   senna-docusate 8.6-50 MG tablet Commonly known as:  Senokot-S Take 2 tablets by mouth daily.       Diet: routine diet  Activity: Advance as tolerated. Pelvic rest for 6 weeks.   Outpatient follow up:4 weeks Follow up Appt:No future appointments. Follow up Visit:No Follow-up on file.  Postpartum contraception: Tubal Ligation  Newborn Data: Live born female  Birth Weight: 6 lb 9.8 oz (3000 g) APGAR: 9, 9  Newborn Delivery   Birth date/time:  10/21/2017 17:32:00 Delivery type:  Vaginal, Spontaneous Delivery      Baby Feeding: Bottle Disposition:home with mother   10/23/2017 Heather Broad, MD  I confirm that I have verified the information documented in the resident's note and that I have also personally reperformed the physical exam and all medical decision making activities.  Patient was seen and examined by me also Agree with  note Patient crying.  States FOB won't bring her other kids in.  She misses them.Pecola Leisure has to stay for bili lights.  Discussed SW/Chaplain, declines for now. RN made aware.  Will be rooming in with baby Vitals stable Labs stable Fundus firm, lochia within normal limits Perineum healing Ext WNL Ready for discharge  Aviva Signs, CNM

## 2017-10-23 NOTE — Discharge Instructions (Signed)

## 2017-12-01 ENCOUNTER — Telehealth: Payer: Self-pay

## 2017-12-01 ENCOUNTER — Ambulatory Visit: Payer: Medicaid Other | Admitting: Obstetrics and Gynecology

## 2017-12-02 NOTE — Telephone Encounter (Signed)
Attempted to contact pt no contact # on file.  Letter sent.

## 2018-11-14 ENCOUNTER — Encounter (HOSPITAL_COMMUNITY): Payer: Self-pay | Admitting: *Deleted

## 2018-11-14 ENCOUNTER — Inpatient Hospital Stay (HOSPITAL_COMMUNITY)
Admission: AD | Admit: 2018-11-14 | Discharge: 2018-11-14 | Disposition: A | Discharge status: Home / Self Care | Payer: Medicaid Other | Source: Ambulatory Visit | Attending: Obstetrics & Gynecology | Admitting: Obstetrics & Gynecology

## 2018-11-14 DIAGNOSIS — O0932 Supervision of pregnancy with insufficient antenatal care, second trimester: Secondary | ICD-10-CM | POA: Insufficient documentation

## 2018-11-14 DIAGNOSIS — Z3A24 24 weeks gestation of pregnancy: Secondary | ICD-10-CM | POA: Insufficient documentation

## 2018-11-14 DIAGNOSIS — R1030 Lower abdominal pain, unspecified: Secondary | ICD-10-CM | POA: Insufficient documentation

## 2018-11-14 DIAGNOSIS — O0930 Supervision of pregnancy with insufficient antenatal care, unspecified trimester: Secondary | ICD-10-CM

## 2018-11-14 DIAGNOSIS — O26899 Other specified pregnancy related conditions, unspecified trimester: Secondary | ICD-10-CM

## 2018-11-14 DIAGNOSIS — O26892 Other specified pregnancy related conditions, second trimester: Secondary | ICD-10-CM | POA: Insufficient documentation

## 2018-11-14 DIAGNOSIS — Z79899 Other long term (current) drug therapy: Secondary | ICD-10-CM | POA: Insufficient documentation

## 2018-11-14 DIAGNOSIS — Z791 Long term (current) use of non-steroidal anti-inflammatories (NSAID): Secondary | ICD-10-CM | POA: Insufficient documentation

## 2018-11-14 DIAGNOSIS — O132 Gestational [pregnancy-induced] hypertension without significant proteinuria, second trimester: Secondary | ICD-10-CM

## 2018-11-14 DIAGNOSIS — R109 Unspecified abdominal pain: Secondary | ICD-10-CM

## 2018-11-14 LAB — TYPE AND SCREEN
ABO/RH(D): O POS
Antibody Screen: NEGATIVE

## 2018-11-14 LAB — DIFFERENTIAL
BASOS ABS: 0 10*3/uL (ref 0.0–0.1)
Basophils Relative: 0 %
EOS ABS: 0.3 10*3/uL (ref 0.0–0.5)
Eosinophils Relative: 3 %
LYMPHS ABS: 1.7 10*3/uL (ref 0.7–4.0)
Lymphocytes Relative: 20 %
MONOS PCT: 4 %
Monocytes Absolute: 0.3 10*3/uL (ref 0.1–1.0)
NEUTROS ABS: 6.6 10*3/uL (ref 1.7–7.7)
Neutrophils Relative %: 73 %

## 2018-11-14 LAB — CBC
HEMATOCRIT: 31.5 % — AB (ref 36.0–46.0)
HEMOGLOBIN: 10.2 g/dL — AB (ref 12.0–15.0)
MCH: 27.1 pg (ref 26.0–34.0)
MCHC: 32.4 g/dL (ref 30.0–36.0)
MCV: 83.8 fL (ref 80.0–100.0)
Platelets: 184 10*3/uL (ref 150–400)
RBC: 3.76 MIL/uL — ABNORMAL LOW (ref 3.87–5.11)
RDW: 14.5 % (ref 11.5–15.5)
WBC: 8.9 10*3/uL (ref 4.0–10.5)
nRBC: 0 % (ref 0.0–0.2)

## 2018-11-14 LAB — WET PREP, GENITAL
Clue Cells Wet Prep HPF POC: NONE SEEN
SPERM: NONE SEEN
TRICH WET PREP: NONE SEEN
YEAST WET PREP: NONE SEEN

## 2018-11-14 LAB — URINALYSIS, ROUTINE W REFLEX MICROSCOPIC
BILIRUBIN URINE: NEGATIVE
GLUCOSE, UA: NEGATIVE mg/dL
Hgb urine dipstick: NEGATIVE
Ketones, ur: 5 mg/dL — AB
Leukocytes, UA: NEGATIVE
NITRITE: NEGATIVE
PH: 7 (ref 5.0–8.0)
Protein, ur: NEGATIVE mg/dL
SPECIFIC GRAVITY, URINE: 1.006 (ref 1.005–1.030)

## 2018-11-14 LAB — HEPATITIS B SURFACE ANTIGEN: Hepatitis B Surface Ag: NEGATIVE

## 2018-11-14 MED ORDER — IBUPROFEN 600 MG PO TABS
600.0000 mg | ORAL_TABLET | Freq: Once | ORAL | Status: AC
  Administered 2018-11-14: 600 mg via ORAL
  Filled 2018-11-14: qty 1

## 2018-11-14 NOTE — MAU Note (Signed)
Pt reports cramping and lower back pain x 3 days, denies bleeding. Positive preg test

## 2018-11-14 NOTE — MAU Provider Note (Addendum)
History     CSN: [id number]  Arrival date and time: 11/14/18 1304   First Provider Initiated Contact with Patient 11/14/18 1358      Chief Complaint  Patient presents with  . Abdominal Pain  . Back Pain  . Possible Pregnancy   HPI   [name] is a 32 y.o. female [phone] @ [redacted]w[redacted]d here in MAU with lower abdominal cramping and lower back pain. The pain started 3 days ago. The pain has gotten worse since 3 days ago. The pain is constant. She has not tried anything for the pain.  The pain is worse when she is up moving around and walking. Sitting up makes the pain better. She has not started prenatal care due to circumstances at home. + fetal movement. No bleeding, or leaking of fluid.   OB History    Gravida  6   Para  4   Term  4   Preterm      AB  1   Living  4     SAB  1   TAB      Ectopic      Multiple  0   Live Births  4           Past Medical History:  Diagnosis Date  . Migraine headache     Past Surgical History:  Procedure Laterality Date  . NO PAST SURGERIES      Family History  Problem Relation Age of Onset  . Deep vein thrombosis Mother   . Cancer Father     Social History   Tobacco Use  . Smoking status: Never Smoker  . Smokeless tobacco: Never Used  Substance Use Topics  . Alcohol use: No  . Drug use: Yes    Types: Marijuana    Comment: last use 11/12/2018    Allergies: No Known Allergies  Medications Prior to Admission  Medication Sig Dispense Refill Last Dose  . ibuprofen (ADVIL,MOTRIN) 600 MG tablet Take 1 tablet (600 mg total) by mouth every 6 (six) hours. 30 tablet 0   . Prenatal Vit-Fe Fumarate-FA (PRENATAL MULTIVITAMIN) TABS tablet Take 1 tablet by mouth daily at 12 noon.   10/21/2017 at Unknown time  . senna-docusate (SENOKOT-S) 8.6-50 MG tablet Take 2 tablets by mouth daily. 60 tablet 0    Results for orders placed or performed during the hospital encounter of 11/14/18 (from the past 48 hour(s))   Urinalysis, Routine w reflex microscopic     Status: Abnormal   Collection Time: 11/14/18  1:36 PM  Result Value Ref Range   Color, Urine STRAW (A) YELLOW   APPearance CLEAR CLEAR   Specific Gravity, Urine 1.006 1.005 - 1.030   pH 7.0 5.0 - 8.0   Glucose, UA NEGATIVE NEGATIVE mg/dL   Hgb urine dipstick NEGATIVE NEGATIVE   Bilirubin Urine NEGATIVE NEGATIVE   Ketones, ur 5 (A) NEGATIVE mg/dL   Protein, ur NEGATIVE NEGATIVE mg/dL   Nitrite NEGATIVE NEGATIVE   Leukocytes, UA NEGATIVE NEGATIVE    Comment: Performed at [facility], [address] [place], [place] [address]  CBC     Status: Abnormal   Collection Time: 11/14/18  2:20 PM  Result Value Ref Range   WBC 8.9 4.0 - 10.5 K/uL   RBC 3.76 (L) 3.87 - 5.11 MIL/uL   Hemoglobin 10.2 (L) 12.0 - 15.0 g/dL   HCT 64.4 (L) 03.4 - 74.2 %   MCV 83.8 80.0 - 100.0 fL   MCH 27.1 26.0 -  34.0 pg   MCHC 32.4 30.0 - 36.0 g/dL   RDW 95.214.5 84.111.5 - 32.415.5 %   Platelets 184 150 - 400 K/uL   nRBC 0.0 0.0 - 0.2 %    Comment: Performed at [facility], [address] [place], [place] [address]  Differential     Status: None   Collection Time: 11/14/18  2:20 PM  Result Value Ref Range   Neutrophils Relative % 73 %   Neutro Abs 6.6 1.7 - 7.7 K/uL   Lymphocytes Relative 20 %   Lymphs Abs 1.7 0.7 - 4.0 K/uL   Monocytes Relative 4 %   Monocytes Absolute 0.3 0.1 - 1.0 K/uL   Eosinophils Relative 3 %   Eosinophils Absolute 0.3 0.0 - 0.5 K/uL   Basophils Relative 0 %   Basophils Absolute 0.0 0.0 - 0.1 K/uL    Comment: Performed at [facility], [address] [place], [place] [address]  Type and screen [facility]     Status: None   Collection Time: 11/14/18  2:20 PM  Result Value Ref Range   ABO/RH(D) O POS    Antibody Screen NEG    Sample Expiration      11/17/2018 Performed at [facility], [address] [place], [place] [address]   Wet prep, genital     Status: Abnormal   Collection Time: 11/14/18  3:09  PM  Result Value Ref Range   Yeast Wet Prep HPF POC NONE SEEN NONE SEEN   Trich, Wet Prep NONE SEEN NONE SEEN   Clue Cells Wet Prep HPF POC NONE SEEN NONE SEEN   WBC, Wet Prep HPF POC FEW (A) NONE SEEN    Comment: FEW BACTERIA SEEN   Sperm NONE SEEN     Comment: Performed at [facility], [address] [place], [place] [address]   Review of Systems  Gastrointestinal: Positive for abdominal pain and nausea.  Genitourinary: Negative for dysuria, flank pain, vaginal bleeding and vaginal discharge.  Musculoskeletal: Positive for back pain.   Physical Exam   Blood pressure 122/66, pulse 86, temperature (!) 97.3 F (36.3 C), temperature source Oral, resp. rate 20, height 5\' 4"  (1.626 m), weight 91.2 kg, last menstrual period 05/30/2018, SpO2 100 %, unknown if currently breastfeeding.  Physical Exam  Constitutional: She is oriented to person, place, and time. She appears well-developed and well-nourished. No distress.  HENT:  Head: Normocephalic.  Eyes: Pupils are equal, round, and reactive to light.  Neck: Neck supple.  GI: Soft. She exhibits no distension. There is no tenderness. There is no rebound and no guarding.  Genitourinary:  Genitourinary Comments: Wet prep and GC collected without speculum  Cervix: closed, thick, posterior.   Musculoskeletal: Normal range of motion.  Neurological: She is alert and oriented to person, place, and time.  Skin: Skin is warm. She is not diaphoretic.  Psychiatric: Her behavior is normal.   Fetal Tracing: Baseline: 140 bpm Variability: moderate  Accelerations: 10x10 Decelerations: None Toco: None  MAU Course  Procedures  None  MDM  UA  Prenatal panel collected  Wet prep and GC  Assessment and Plan   A:  1. Abdominal pain during pregnancy, antepartum   2. No prenatal care in current pregnancy in second trimester   3. [redacted] weeks gestation of pregnancy     P:  Discharge home in stable condition Patient [facility].  US ordered for outpatient.  Prenatal panel pending Recommend  pregnancy support belt   Laticha Ferrucci, Harolyn Rutherford, NP 11/14/2018 5:15 PM

## 2018-11-14 NOTE — Discharge Instructions (Signed)
Abdominal Pain During Pregnancy Abdominal pain is common in pregnancy. Most of the time, it does not cause harm. There are many causes of abdominal pain. Some causes are more serious than others and sometimes the cause is not known. Abdominal pain can be a sign that something is very wrong with the pregnancy or the pain may have nothing to do with the pregnancy. Always tell your health care provider if you have any abdominal pain. Follow these instructions at home:  Do not have sex or put anything in your vagina until your symptoms go away completely.  Watch your abdominal pain for any changes.  Get plenty of rest until your pain improves.  Drink enough fluid to keep your urine clear or pale yellow.  Take over-the-counter or prescription medicines only as told by your health care provider.  Keep all follow-up visits as told by your health care provider. This is important. Contact a health care provider if:  You have a fever.  Your pain gets worse or you have cramping.  Your pain continues after resting. Get help right away if:  You are bleeding, leaking fluid, or passing tissue from the vagina.  You have vomiting or diarrhea that does not go away.  You have painful or bloody urination.  You notice a decrease in your baby's movements.  You feel very weak or faint.  You have shortness of breath.  You develop a severe headache with abdominal pain.  You have abnormal vaginal discharge with abdominal pain. This information is not intended to replace advice given to you by your health care provider. Make sure you discuss any questions you have with your health care provider. Document Released: 12/14/2005 Document Revised: 09/24/2016 Document Reviewed: 07/13/2013 Elsevier Interactive Patient Education  2018 Else[employer] Inc[employer]   Round Ligament Pain During Pregnancy   Round ligament pain is a sharp pain or jabbing feeling often felt in the lower belly or groin area on one or both  sides. It is one of the most common complaints during pregnancy and is considered a normal part of pregnancy. It is most often felt during the second trimester.   Here is what you need to know about round ligament pain, including some tips to help you feel better.   Causes of Round Ligament Pain   Several thick ligaments surround and support your womb (uterus) as it grows during pregnancy. One of them is called the round ligament.   The round ligament connects the front part of the womb to your groin, the area where your legs attach to your pelvis. The round ligament normally tightens and relaxes slowly.   As your baby and womb grow, the round ligament stretches. That makes it more likely to become strained.   Sudden movements can cause the ligament to tighten quickly, like a rubber band snapping. This causes a sudden and quick jabbing feeling.   Symptoms of Round Ligament Pain   Round ligament pain can be concerning and uncomfortable. But it is considered normal as your body changes during pregnancy.   The symptoms of round ligament pain include a sharp, sudden spasm in the belly. It usually affects the right side, but it may happen on both sides. The pain only lasts a few seconds.   Exercise may cause the pain, as will rapid movements such as:  sneezing  coughing  laughing  rolling over in bed  standing up too quickly   Treatment of Round Ligament Pain   Here are some tips that  may help reduce your discomfort:   Pain relief. Take over-the-counter acetaminophen for pain, if necessary. Ask your doctor if this is OK.   Exercise. Get plenty of exercise to keep your stomach (core) muscles strong. Doing stretching exercises or prenatal yoga can be helpful. Ask your doctor which exercises are safe for you and your baby.   A helpful exercise involves putting your hands and knees on the floor, lowering your head, and pushing your backside into the air.   Avoid sudden movements. Change  positions slowly (such as standing up or sitting down) to avoid sudden movements that may cause stretching and pain.   Flex your hips. Bend and flex your hips before you cough, sneeze, or laugh to avoid pulling on the ligaments.   Apply warmth. A heating pad or warm bath may be helpful. Ask your doctor if this is OK. Extreme heat can be dangerous to the baby.   You should try to modify your daily activity level and avoid positions that may worsen the condition.   When to Call the Doctor/Midwife   Always tell your doctor or midwife about any type of pain you have during pregnancy. Round ligament pain is quick and doesn't last long.   Call your health care provider immediately if you have:  severe pain  fever  chills  pain on urination  difficulty walking   Belly pain during pregnancy can be due to many different causes. It is important for your doctor to rule out more serious conditions, including pregnancy complications such as placenta abruption or non-pregnancy illnesses such as:  inguinal hernia  appendicitis  stomach, liver, and kidney problems  Preterm labor pains may sometimes be mistaken for round ligament pain.       PREGNANCY SUPPORT BELT: You are not alone, Seventy-five percent of women have some sort of abdominal or back pain at some point in their pregnancy. Your baby is growing at a fast pace, which means that your whole body is rapidly trying to adjust to the changes. As your uterus grows, your back may start feeling a bit under stress and this can result in back or abdominal pain that can go from mild, and therefore bearable, to severe pains that will not allow you to sit or lay down comfortably, When it comes to dealing with pregnancy-related pains and cramps, some pregnant women usually prefer natural remedies, which the market is filled with nowadays. For example, wearing a pregnancy support belt can help ease and lessen your discomfort and pain. WHAT ARE THE BENEFITS  OF WEARING A PREGNANCY SUPPORT BELT? A pregnancy support belt provides support to the lower portion of the belly taking some of the weight of the growing uterus and distributing to the other parts of your body. It is designed make you comfortable and gives you extra support. Over the years, the pregnancy apparel market has been studying the needs and wants of pregnant women and they have come up with the most comfortable pregnancy support belts that woman could ever ask for. In fact, you will no longer have to wear a stretched-out or bulky pregnancy belt that is visible underneath your clothes and makes you feel even more uncomfortable. Nowadays, a pregnancy support belt is made of comfortable and stretchy materials that will not irritate your skin but will actually make you feel at ease and you will not even notice you are wearing it. They are easy to put on and adjust during the day and can be worn at  night for additional support.  BENEFITS:  Relives Back pain  Relieves Abdominal Muscle and Leg Pain  Stabilizes the Pelvic Ring  Offers a Cushioned Abdominal Lift Pad  Relieves pressure on the Sciatic Nerve Within Minutes WHERE TO GET YOUR PREGNANCY BELT: [employer] [phone] @[address]  [address] [place], [place] [address]

## 2018-11-15 LAB — RPR: RPR: NONREACTIVE

## 2018-11-15 LAB — GC/CHLAMYDIA PROBE AMP (~~LOC~~) NOT AT ARMC
CHLAMYDIA, DNA PROBE: NEGATIVE
Neisseria Gonorrhea: NEGATIVE

## 2018-11-15 LAB — HIV ANTIBODY (ROUTINE TESTING W REFLEX): HIV SCREEN 4TH GENERATION: NONREACTIVE

## 2018-11-15 LAB — RUBELLA SCREEN: Rubella: 1.59 index (ref >0.99)

## 2018-11-23 ENCOUNTER — Encounter (HOSPITAL_COMMUNITY): Payer: Self-pay

## 2018-11-30 ENCOUNTER — Ambulatory Visit (HOSPITAL_COMMUNITY)
Admission: RE | Admit: 2018-11-30 | Discharge: 2018-11-30 | Disposition: A | Discharge status: Home / Self Care | Payer: Self-pay | Source: Ambulatory Visit | Attending: Obstetrics and Gynecology | Admitting: Obstetrics and Gynecology

## 2018-11-30 ENCOUNTER — Encounter (HOSPITAL_COMMUNITY): Payer: Self-pay

## 2018-11-30 DIAGNOSIS — Z3A24 24 weeks gestation of pregnancy: Secondary | ICD-10-CM

## 2018-11-30 DIAGNOSIS — O0932 Supervision of pregnancy with insufficient antenatal care, second trimester: Secondary | ICD-10-CM | POA: Insufficient documentation

## 2018-11-30 DIAGNOSIS — Z363 Encounter for antenatal screening for malformations: Secondary | ICD-10-CM | POA: Insufficient documentation

## 2018-11-30 DIAGNOSIS — Z3A26 26 weeks gestation of pregnancy: Secondary | ICD-10-CM | POA: Insufficient documentation

## 2018-11-30 DIAGNOSIS — O0930 Supervision of pregnancy with insufficient antenatal care, unspecified trimester: Secondary | ICD-10-CM

## 2018-11-30 DIAGNOSIS — O99212 Obesity complicating pregnancy, second trimester: Secondary | ICD-10-CM | POA: Insufficient documentation

## 2018-12-01 ENCOUNTER — Other Ambulatory Visit (HOSPITAL_COMMUNITY): Payer: Self-pay | Admitting: *Deleted

## 2018-12-01 DIAGNOSIS — O99210 Obesity complicating pregnancy, unspecified trimester: Secondary | ICD-10-CM

## 2018-12-28 NOTE — L&D Delivery Note (Signed)
Patient is a 33 y.o. now G6P5 s/p NSVD- precip in MAU at [redacted]w[redacted]d, who was admitted for SOL- active labor.  SROM on EMS truck at 0140- clear fluid   She progressed without augmentation to complete and pushed 1 minute to deliver.  Cord clamping delayed by several minutes then clamped by CNM and cut by CNM.  Placenta intact and spontaneous, bleeding minimal.  First degree laceration not repaired- hemostatic.  Mom and baby stable prior to transfer to postpartum. She plans on breastfeeding and formula feeding. She requests BTL for birth control.  Delivery Note At 1:52 AM a viable and healthy female was delivered via Vaginal, Spontaneous (Presentation: ROA ).  APGAR: 9, 9; weight pending  .    Placenta intact and spontaneous, bleeding minimal. 3V Cord:  with no complications.  Anesthesia:  None Episiotomy: None Lacerations:  1st degree  Suture Repair: none Est. Blood Loss (mL):  75  Mom to postpartum.  Baby to Couplet care / Skin to Skin.  Sharyon Cable CNM 03/07/2019, 2:22 AM

## 2018-12-29 ENCOUNTER — Ambulatory Visit (HOSPITAL_COMMUNITY)
Admission: RE | Admit: 2018-12-29 | Discharge: 2018-12-29 | Disposition: A | Discharge status: Home / Self Care | Payer: Self-pay | Source: Ambulatory Visit | Attending: Obstetrics and Gynecology | Admitting: Obstetrics and Gynecology

## 2018-12-29 DIAGNOSIS — Z3A30 30 weeks gestation of pregnancy: Secondary | ICD-10-CM

## 2018-12-29 DIAGNOSIS — O99210 Obesity complicating pregnancy, unspecified trimester: Secondary | ICD-10-CM | POA: Insufficient documentation

## 2018-12-29 DIAGNOSIS — O99213 Obesity complicating pregnancy, third trimester: Secondary | ICD-10-CM

## 2018-12-29 DIAGNOSIS — O0933 Supervision of pregnancy with insufficient antenatal care, third trimester: Secondary | ICD-10-CM

## 2018-12-29 DIAGNOSIS — Z362 Encounter for other antenatal screening follow-up: Secondary | ICD-10-CM

## 2019-01-11 ENCOUNTER — Inpatient Hospital Stay (HOSPITAL_COMMUNITY)
Admission: AD | Admit: 2019-01-11 | Discharge: 2019-01-11 | Disposition: A | Discharge status: Home / Self Care | Payer: Self-pay | Source: Ambulatory Visit | Attending: Obstetrics & Gynecology | Admitting: Obstetrics & Gynecology

## 2019-01-11 ENCOUNTER — Encounter (HOSPITAL_COMMUNITY): Payer: Self-pay | Admitting: *Deleted

## 2019-01-11 DIAGNOSIS — O26893 Other specified pregnancy related conditions, third trimester: Secondary | ICD-10-CM | POA: Insufficient documentation

## 2019-01-11 DIAGNOSIS — Z3A33 33 weeks gestation of pregnancy: Secondary | ICD-10-CM

## 2019-01-11 DIAGNOSIS — O368130 Decreased fetal movements, third trimester, not applicable or unspecified: Secondary | ICD-10-CM | POA: Insufficient documentation

## 2019-01-11 DIAGNOSIS — O212 Late vomiting of pregnancy: Secondary | ICD-10-CM | POA: Insufficient documentation

## 2019-01-11 DIAGNOSIS — R12 Heartburn: Secondary | ICD-10-CM

## 2019-01-11 DIAGNOSIS — O0933 Supervision of pregnancy with insufficient antenatal care, third trimester: Secondary | ICD-10-CM

## 2019-01-11 DIAGNOSIS — R519 Headache, unspecified: Secondary | ICD-10-CM

## 2019-01-11 DIAGNOSIS — R51 Headache: Secondary | ICD-10-CM | POA: Insufficient documentation

## 2019-01-11 DIAGNOSIS — K920 Hematemesis: Secondary | ICD-10-CM

## 2019-01-11 DIAGNOSIS — Z3A32 32 weeks gestation of pregnancy: Secondary | ICD-10-CM | POA: Insufficient documentation

## 2019-01-11 HISTORY — DX: Major depressive disorder, single episode, unspecified: F32.9

## 2019-01-11 HISTORY — DX: Anxiety disorder, unspecified: F41.9

## 2019-01-11 HISTORY — DX: Depression, unspecified: F32.A

## 2019-01-11 LAB — URINALYSIS, ROUTINE W REFLEX MICROSCOPIC
BILIRUBIN URINE: NEGATIVE
Glucose, UA: NEGATIVE mg/dL
Hgb urine dipstick: NEGATIVE
Ketones, ur: NEGATIVE mg/dL
Nitrite: NEGATIVE
Protein, ur: 30 mg/dL — AB
SPECIFIC GRAVITY, URINE: 1.026 (ref 1.005–1.030)
pH: 6 (ref 5.0–8.0)

## 2019-01-11 MED ORDER — CALCIUM CARBONATE ANTACID 500 MG PO CHEW
1.0000 | CHEWABLE_TABLET | Freq: Once | ORAL | Status: AC
  Administered 2019-01-11: 200 mg via ORAL
  Filled 2019-01-11: qty 1

## 2019-01-11 MED ORDER — CALCIUM CARBONATE ANTACID 500 MG PO CHEW: 1.0000 | CHEWABLE_TABLET | Freq: Two times a day (BID) | ORAL | 2 refills | Status: DC | PRN

## 2019-01-11 MED ORDER — ONDANSETRON 8 MG PO TBDP
8.0000 mg | ORAL_TABLET | Freq: Once | ORAL | Status: AC
  Administered 2019-01-11: 8 mg via ORAL
  Filled 2019-01-11: qty 1

## 2019-01-11 MED ORDER — ACETAMINOPHEN 160 MG/5ML PO SOLN
650.0000 mg | Freq: Once | ORAL | Status: AC
  Administered 2019-01-11: 650 mg via ORAL
  Filled 2019-01-11: qty 20.3

## 2019-01-11 MED ORDER — ACETAMINOPHEN 160 MG/5ML PO LIQD: 640.0000 mg | Freq: Four times a day (QID) | ORAL | 1 refills | Status: DC | PRN

## 2019-01-11 MED ORDER — ONDANSETRON 4 MG PO TBDP: 4.0000 mg | ORAL_TABLET | Freq: Three times a day (TID) | ORAL | 0 refills | Status: DC | PRN

## 2019-01-11 NOTE — MAU Note (Signed)
Pt reports this am she started vomiting and it had blood in it, had another episode this afternoon. Denies pain at this time. Denies fever or diarrhea.

## 2019-01-11 NOTE — Discharge Instructions (Signed)
Third Trimester of Pregnancy  The third trimester is from week 28 through week 40 (months 7 through 9). This trimester is when your unborn baby (fetus) is growing very fast. At the end of the ninth month, the unborn baby is about 20 inches in length. It weighs about 6-10 pounds. Follow these instructions at home: Medicines  Take over-the-counter and prescription medicines only as told by your doctor. Some medicines are safe and some medicines are not safe during pregnancy.  Take a prenatal vitamin that contains at least 600 micrograms (mcg) of folic acid.  If you have trouble pooping (constipation), take medicine that will make your stool soft (stool softener) if your doctor approves. Eating and drinking   Eat regular, healthy meals.  Avoid raw meat and uncooked cheese.  If you get low calcium from the food you eat, talk to your doctor about taking a daily calcium supplement.  Eat four or five small meals rather than three large meals a day.  Avoid foods that are high in fat and sugars, such as fried and sweet foods.  To prevent constipation: ? Eat foods that are high in fiber, like fresh fruits and vegetables, whole grains, and beans. ? Drink enough fluids to keep your pee (urine) clear or pale yellow. Activity  Exercise only as told by your doctor. Stop exercising if you start to have cramps.  Avoid heavy lifting, wear low heels, and sit up straight.  Do not exercise if it is too hot, too humid, or if you are in a place of great height (high altitude).  You may continue to have sex unless your doctor tells you not to. Relieving pain and discomfort  Wear a good support bra if your breasts are tender.  Take frequent breaks and rest with your legs raised if you have leg cramps or low back pain.  Take warm water baths (sitz baths) to soothe pain or discomfort caused by hemorrhoids. Use hemorrhoid cream if your doctor approves.  If you develop puffy, bulging veins (varicose  veins) in your legs: ? Wear support hose or compression stockings as told by your doctor. ? Raise (elevate) your feet for 15 minutes, 3-4 times a day. ? Limit salt in your food. Safety  Wear your seat belt when driving.  Make a list of emergency phone numbers, including numbers for family, friends, the hospital, and police and fire departments. Preparing for your baby's arrival To prepare for the arrival of your baby:  Take prenatal classes.  Practice driving to the hospital.  Visit the hospital and tour the maternity area.  Talk to your work about taking leave once the baby comes.  Pack your hospital bag.  Prepare the baby's room.  Go to your doctor visits.  Buy a rear-facing car seat. Learn how to install it in your car. General instructions  Do not use hot tubs, steam rooms, or saunas.  Do not use any products that contain nicotine or tobacco, such as cigarettes and e-cigarettes. If you need help quitting, ask your doctor.  Do not drink alcohol.  Do not douche or use tampons or scented sanitary pads.  Do not cross your legs for long periods of time.  Do not travel for long distances unless you must. Only do so if your doctor says it is okay.  Visit your dentist if you have not gone during your pregnancy. Use a soft toothbrush to brush your teeth. Be gentle when you floss.  Avoid cat litter boxes and soil  used by cats. These carry germs that can cause birth defects in the baby and can cause a loss of your baby (miscarriage) or stillbirth.  Keep all your prenatal visits as told by your doctor. This is important. Contact a doctor if:  You are not sure if you are in labor or if your water has broken.  You are dizzy.  You have mild cramps or pressure in your lower belly.  You have a nagging pain in your belly area.  You continue to feel sick to your stomach, you throw up, or you have watery poop.  You have bad smelling fluid coming from your vagina.  You have  pain when you pee. Get help right away if:  You have a fever.  You are leaking fluid from your vagina.  You are spotting or bleeding from your vagina.  You have severe belly cramps or pain.  You lose or gain weight quickly.  You have trouble catching your breath and have chest pain.  You notice sudden or extreme puffiness (swelling) of your face, hands, ankles, feet, or legs.  You have not felt the baby move in over an hour.  You have severe headaches that do not go away with medicine.  You have trouble seeing.  You are leaking, or you are having a gush of fluid, from your vagina before you are 37 weeks.  You have regular belly spasms (contractions) before you are 37 weeks. Summary  The third trimester is from week 28 through week 40 (months 7 through 9). This time is when your unborn baby is growing very fast.  Follow your doctor's advice about medicine, food, and activity.  Get ready for the arrival of your baby by taking prenatal classes, getting all the baby items ready, preparing the baby's room, and visiting your doctor to be checked.  Get help right away if you are bleeding from your vagina, or you have chest pain and trouble catching your breath, or if you have not felt your baby move in over an hour. This information is not intended to replace advice given to you by your health care provider. Make sure you discuss any questions you have with your health care provider. Document Released: 03/10/2010 Document Revised: 01/19/2017 Document Reviewed: 01/19/2017 Elsevier Interactive Patient Education  2019 Elsevier Inc. Heartburn During Pregnancy  Heartburn is pain or discomfort in the throat or chest. It may cause a burning feeling. It happens when stomach acid moves up into the tube that carries food from your mouth to your stomach (esophagus). Heartburn is common during pregnancy. It usually goes away or gets better after giving birth. Follow these instructions at  home: Eating and drinking  Do not drink alcohol while you are pregnant.  Figure out which foods and beverages make you feel worse, and avoid them.  Beverages that you may want to avoid include: ? Coffee and tea (with or without caffeine). ? Energy drinks and sports drinks. ? Bubbly (carbonated) drinks or sodas. ? Citrus fruit juices.  Foods that you may want to avoid include: ? Chocolate and cocoa. ? Peppermint and mint flavorings. ? Garlic, onions, and horseradish. ? Spicy and acidic foods. These include peppers, chili powder, curry powder, vinegar, hot sauces, and barbecue sauce. ? Citrus fruits, such as oranges, lemons, and limes. ? Tomato-based foods, such as red sauce, chili, and salsa. ? Fried and fatty foods, such as donuts, french fries, potato chips, and high-fat dressings. ? High-fat meats, such as hot dogs, cold  cuts, sausage, ham, and bacon. ? High-fat dairy items, such as whole milk, butter, and cheese.  Eat small meals often, instead of large meals.  Avoid drinking a lot of liquid with your meals.  Avoid eating meals during the 2-3 hours before you go to bed.  Avoid lying down right after you eat.  Do not exercise right after you eat. Medicines  Take over-the-counter and prescription medicines only as told by your doctor.  Do not take aspirin, ibuprofen, or other NSAIDs unless your doctor tells you to do that.  Your doctor may tell you to avoid medicines that have sodium bicarbonate in them. General instructions   If told, raise the head of your bed about 6 inches (15 cm). You can do this by putting blocks under the legs. Sleeping with more pillows does not help with heartburn.  Do not use any products that contain nicotine or tobacco, such as cigarettes and e-cigarettes. If you need help quitting, ask your doctor.  Wear loose-fitting clothing.  Try to lower your stress, such as with yoga or meditation. If you need help, ask your doctor.  Stay at a  healthy weight. If you are overweight, work with your doctor to safely lose weight.  Keep all follow-up visits as told by your doctor. This is important. Contact a doctor if:  You get new symptoms.  Your symptoms do not get better with treatment.  You have weight loss and you do not know why.  You have trouble swallowing.  You make loud sounds when you breathe (wheeze).  You have a cough that does not go away.  You have heartburn often for more than 2 weeks.  You feel sick to your stomach (nauseous), and this does not get better with treatment.  You are throwing up (vomiting), and this does not get better with treatment.  You have pain in your belly (abdomen). Get help right away if:  You have very bad chest pain that spreads to your arm, neck, or jaw.  You feel sweaty, dizzy, or light-headed.  You have trouble breathing.  You have pain when swallowing.  You throw up and your throw-up looks like blood or coffee grounds.  Your poop (stool) is bloody or black. This information is not intended to replace advice given to you by your health care provider. Make sure you discuss any questions you have with your health care provider. Document Released: 01/16/2011 Document Revised: 08/31/2016 Document Reviewed: 08/31/2016 Elsevier Interactive Patient Education  2019 [employer]

## 2019-01-11 NOTE — MAU Provider Note (Signed)
History     CSN: [id number]  Arrival date and time: 01/11/19 1646   First Provider Initiated Contact with Patient 01/11/19 1713      Chief Complaint  Patient presents with  . Emesis  . Nausea  . Decreased Fetal Movement   Heather Kim is a 33 y.o. [id number] at [redacted]w[redacted]d who presents for Emesis; Nausea; and Decreased Fetal Movement.  Patient presents for concern regarding blood in her vomit.  Patient states she had 3-4 bouts of vomiting around 10am that was uncontrollable.  She states that she had another episode around 3pm.  She states that she noted blood with every vomiting occurrence and states initially it was drops and then "it was smeared in."  Patient states that the vomit was orange in color but contributes it to bile due to taste and smell.  Patient endorses heartburn that sometimes lasts overnight, but she states she does not treat it because she doesn't like to take pills.She also reports a HA that started after vomiting episodes and she did not take any medication for it.  She states she has not eaten anything since yesterday since 2 or 3 pm and states that she can't force herself to eat anything because it comes right back up.  However, patient has been drinking water and juice without issues. Patient has not received a flu shot and has not been in contact with sick individuals.   endorses fetal movement, but states it is decreased.  She has no perception of contractions and LOF, VB.  Patient further reports that she has never had any prenatal care.     OB History    Gravida  6   Para  4   Term  4   Preterm      AB  1   Living  4     SAB  1   TAB      Ectopic      Multiple  0   Live Births  4           Past Medical History:  Diagnosis Date  . Migraine headache     Past Surgical History:  Procedure Laterality Date  . NO PAST SURGERIES      Family History  Problem Relation Age of Onset  . Deep vein thrombosis Mother   . Cancer Father      Social History   Tobacco Use  . Smoking status: Never Smoker  . Smokeless tobacco: Never Used  Substance Use Topics  . Alcohol use: No  . Drug use: Yes    Types: Marijuana    Comment: last use 11/12/2018    Allergies: No Known Allergies  Medications Prior to Admission  Medication Sig Dispense Refill Last Dose  . Prenatal Vit-Fe Fumarate-FA (PRENATAL VITAMIN PO) Take by mouth.   01/10/2019 at Unknown time    Review of Systems  Constitutional: Negative for chills and fever.  HENT: Negative for rhinorrhea and sore throat.   Respiratory: Negative for cough.   Gastrointestinal: Positive for nausea and vomiting. Negative for blood in stool, constipation and diarrhea.  Genitourinary: Negative for dysuria and vaginal discharge.  Neurological: Positive for headaches. Negative for dizziness and light-headedness.   Physical Exam   Blood pressure 121/65, pulse 78, temperature 98.4 F (36.9 C), resp. rate 17, height 5\' 4"  (1.626 m), weight 94.3 kg, last menstrual period 05/30/2018, SpO2 99 %, unknown if currently breastfeeding.  Physical Exam  Constitutional: She is oriented to person, place, and time.  She appears well-developed and well-nourished. No distress.  HENT:  Head: Normocephalic and atraumatic.  Mouth/Throat: Oropharynx is clear and moist. No uvula swelling or lacerations. No oropharyngeal exudate, posterior oropharyngeal edema, posterior oropharyngeal erythema or tonsillar abscesses.  Eyes: Conjunctivae are normal.  Neck: Normal range of motion.  Cardiovascular: Normal rate and normal heart sounds.  Respiratory: Effort normal and breath sounds normal.  GI: Soft. Bowel sounds are normal.  Neurological: She is alert and oriented to person, place, and time.  Skin: Skin is warm and dry.  Psychiatric: She has a normal mood and affect. Her behavior is normal.     Fetal Assessment 130 bpm, Mod Var, -Decels, +Accels Toco: None graphed  MAU Course  Procedures Results  for orders placed or performed during the hospital encounter of 01/11/19 (from the past 24 hour(s))  Urinalysis, Routine w reflex microscopic     Status: Abnormal   Collection Time: 01/11/19  5:02 PM  Result Value Ref Range   Color, Urine YELLOW YELLOW   APPearance HAZY (A) CLEAR   Specific Gravity, Urine 1.026 1.005 - 1.030   pH 6.0 5.0 - 8.0   Glucose, UA NEGATIVE NEGATIVE mg/dL   Hgb urine dipstick NEGATIVE NEGATIVE   Bilirubin Urine NEGATIVE NEGATIVE   Ketones, ur NEGATIVE NEGATIVE mg/dL   Protein, ur 30 (A) NEGATIVE mg/dL   Nitrite NEGATIVE NEGATIVE   Leukocytes, UA SMALL (A) NEGATIVE   RBC / HPF 0-5 0 - 5 RBC/hpf   WBC, UA 11-20 0 - 5 WBC/hpf   Bacteria, UA RARE (A) NONE SEEN   Squamous Epithelial / LPF 6-10 0 - 5   Mucus PRESENT     MDM PE Labs; UA EFM Pain Medication Antiemetic  Assessment and Plan  IUP at 32.2wks Cat I FT N/V Headache Lack of PNC  -Exam findings discussed. -Educated on heartburn and long term effects on esophagus when not treated to include vomiting, chest pain, and bloody vomit. -Discussed proper treatment of symptoms to avoid long term effects like GI bleeds. -Patient agreeable to treatment today as it requires no pills.  -Give Zofran ODT, Tums, and Liquid Tylenol Now -Reactive NST -Will reassess   Follow Up (7:32 PM) -Patient reports improvement of all symptoms -Informed of appt made, at [facility], for Jan 19, 2019 at 0955 for NOB appt -Rx for Tums 500mg  tablets, Zofran 4mg  ODT, and Liquid Tylenol 640mg  sent to pharmacy on file. -Patient encouraged to utilize medications and treat symptoms as appropriate. -Encouraged to call or return to MAU if symptoms worsen or with the onset of new symptoms. -Discharged to home in improved condition  Cherre Robins MSN, CNM 01/11/2019, 5:13 PM

## 2019-01-19 ENCOUNTER — Ambulatory Visit (INDEPENDENT_AMBULATORY_CARE_PROVIDER_SITE_OTHER): Payer: Self-pay | Admitting: Obstetrics and Gynecology

## 2019-01-19 ENCOUNTER — Encounter: Payer: Self-pay | Admitting: Obstetrics and Gynecology

## 2019-01-19 VITALS — BP 116/72 | HR 81 | Wt 203.0 lb

## 2019-01-19 DIAGNOSIS — O3443 Maternal care for other abnormalities of cervix, third trimester: Secondary | ICD-10-CM

## 2019-01-19 DIAGNOSIS — R87619 Unspecified abnormal cytological findings in specimens from cervix uteri: Secondary | ICD-10-CM

## 2019-01-19 DIAGNOSIS — O0930 Supervision of pregnancy with insufficient antenatal care, unspecified trimester: Secondary | ICD-10-CM | POA: Insufficient documentation

## 2019-01-19 DIAGNOSIS — O0933 Supervision of pregnancy with insufficient antenatal care, third trimester: Secondary | ICD-10-CM

## 2019-01-19 DIAGNOSIS — Z3493 Encounter for supervision of normal pregnancy, unspecified, third trimester: Secondary | ICD-10-CM

## 2019-01-19 DIAGNOSIS — Z87898 Personal history of other specified conditions: Secondary | ICD-10-CM

## 2019-01-19 DIAGNOSIS — Z659 Problem related to unspecified psychosocial circumstances: Secondary | ICD-10-CM

## 2019-01-19 DIAGNOSIS — N949 Unspecified condition associated with female genital organs and menstrual cycle: Secondary | ICD-10-CM

## 2019-01-19 DIAGNOSIS — Z3490 Encounter for supervision of normal pregnancy, unspecified, unspecified trimester: Secondary | ICD-10-CM | POA: Insufficient documentation

## 2019-01-19 DIAGNOSIS — F1291 Cannabis use, unspecified, in remission: Secondary | ICD-10-CM

## 2019-01-19 DIAGNOSIS — Z23 Encounter for immunization: Secondary | ICD-10-CM

## 2019-01-19 DIAGNOSIS — Z609 Problem related to social environment, unspecified: Secondary | ICD-10-CM

## 2019-01-19 MED ORDER — COMFORT FIT MATERNITY SUPP LG MISC: 1.0000 | Freq: Every day | 0 refills | Status: DC | PRN

## 2019-01-19 NOTE — Patient Instructions (Addendum)
PREGNANCY SUPPORT BELT: You are not alone, Seventy-five percent of women have some sort of abdominal or back pain at some point in their pregnancy. Your baby is growing at a fast pace, which means that your whole body is rapidly trying to adjust to the changes. As your uterus grows, your back may start feeling a bit under stress and this can result in back or abdominal pain that can go from mild, and therefore bearable, to severe pains that will not allow you to sit or lay down comfortably, When it comes to dealing with pregnancy-related pains and cramps, some pregnant women usually prefer natural remedies, which the market is filled with nowadays. For example, wearing a pregnancy support belt can help ease and lessen your discomfort and pain. WHAT ARE THE BENEFITS OF WEARING A PREGNANCY SUPPORT BELT? A pregnancy support belt provides support to the lower portion of the belly taking some of the weight of the growing uterus and distributing to the other parts of your body. It is designed make you comfortable and gives you extra support. Over the years, the pregnancy apparel market has been studying the needs and wants of pregnant women and they have come up with the most comfortable pregnancy support belts that woman could ever ask for. In fact, you will no longer have to wear a stretched-out or bulky pregnancy belt that is visible underneath your clothes and makes you feel even more uncomfortable. Nowadays, a pregnancy support belt is made of comfortable and stretchy materials that will not irritate your skin but will actually make you feel at ease and you will not even notice you are wearing it. They are easy to put on and adjust during the day and can be worn at night for additional support.  BENEFITS: . Relives Back pain . Relieves Abdominal Muscle and Leg Pain . Stabilizes the Pelvic Ring . Offers a Cushioned Abdominal Lift Pad . Relieves pressure on the Sciatic Nerve Within Minutes WHERE TO GET  YOUR PREGNANCY BELT: [employer] [phone] @2301  [address] [place], [place] [address]   AREA PEDIATRIC/FAMILY PRACTICE PHYSICIANS  [facility] [address] [place], [place]  [address] Phone - [phone]   Fax - [phone]  [facility] [address] [place], [place] [address] Phone - [phone]   Fax - [phone]  [facility] [address] [place], [place]  [address] Phone - [phone]   Fax - [phone]  [facility] [address] [place], [place]  [address] Phone - [phone]   Fax - [phone]  [facility] [address] [place], [place]  [address] Phone - [phone]   Fax - [phone]  [facility] [address], [address] [place], [place]  [address] Phone - [phone]   Fax - [phone]  [facility] [place] [facility] [address] [place], [place]  [address] Phone - [phone]   Fax - [phone]  [facility] AT [facility] [address] [place], [address] [place], [place]  [address] Phone - [phone]   Fax - [phone]  [facility] AT [facility] [address] [place], [place]  [address] Phone - [phone]   Fax - [phone] [facility] AT [place] [address] [place], [place]  [address] Phone - [phone]   Fax - [phone]  [facility] [address] [place], [place]  [address] Phone - [phone]  Fax - [phone]  [facility] [address] [place], [place]  [address] Phone - [phone]   Fax - [phone]  [facility] [place] [address] [place], [place]  [address] Phone - [phone]   Fax - [phone]  [facility] [address] [place], [place]  [address] Phone - [phone]  [facility] [address]  [place], [place]  [address] Phone - [phone]   Fax - [phone]  [facility] [address] [place], [place]  [address] Phone - [phone]   Fax - [phone]  [facility] [address] [place], [place]  [address] Phone - [phone]   Fax - [phone]  [facility] [address] [place], [place]  [address] Phone - [phone]   Fax - [phone]   [facility] [address] [place] [address] [place], [place]  [address] Phone - [phone]   Fax - [phone]  [facility] [address] [place], [place]  [address] Phone - [phone]   Fax - [phone]  DAVID RUBIN [address] [place], [place]  [address] Phone - [phone]   Fax - [phone]  [facility] [address] [place], [place]  [address] Phone - [phone]   Fax - [phone]  [place] [facility] Alita Chyle [address] [place], [place]  [address] Phone - [phone]   Fax - [phone] Gerarda Fraction [address] [place], [place]  [address] Phone - [phone]   Fax - [phone]  [facility] [address] [place], [place]  [address] Phone - [phone]   Fax - [phone]  [facility] - [place] [address] [place], [place]  [address] Phone - [phone]   Fax - [phone]  [facility] [place] Wyvonne Lenz MD [address] [place] [address] Phone [phone]  Fax [phone]

## 2019-01-19 NOTE — Progress Notes (Signed)
Subjective:   Heather Kim is a 33 y.o. [id number] at [redacted]w[redacted]d by LMP being seen today for her first obstetrical visit.  Her obstetrical history is significant for late to care, unsupportive partner, round ligament pain, history of marijuana use.  Patient does intend to breast feed. Pregnancy history fully reviewed.  Patient reports no complaints.  HISTORY: OB History  Gravida Para Term Preterm AB Living  6 4 4  0 1 4  SAB TAB Ectopic Multiple Live Births  1 0 0 0 4    # Outcome Date GA Lbr Len/2nd Weight Sex Delivery Anes PTL Lv  6 Current           5 Term 10/21/17 [redacted]w[redacted]d 09:15 / 00:17 6 lb 9.8 oz (3 kg) F Vag-Spont EPI  LIV     Name: Heather Kim     Apgar1: 9  Apgar5: 9  4 Term 05/31/16 [redacted]w[redacted]d / 00:07 7 lb 6.3 oz (3.354 kg) M Vag-Spont EPI  LIV     Name: Heather Kim     Apgar1: 8  Apgar5: 9  3 Term 03/24/13    M Vag-Spont  N LIV  2 Term 11/01/09    F Vag-Spont EPI N LIV  1 SAB 2008            Last pap smear was done 2016 and was normal, pap not done, patient is 33 weeks    Past Medical History:  Diagnosis Date  . Anxiety   . Depression   . Migraine headache    Past Surgical History:  Procedure Laterality Date  . NO PAST SURGERIES     Family History  Problem Relation Age of Onset  . Deep vein thrombosis Mother   . Cancer Father    Social History   Tobacco Use  . Smoking status: Never Smoker  . Smokeless tobacco: Never Used  Substance Use Topics  . Alcohol use: No  . Drug use: Yes    Types: Marijuana    Comment: last use 11/12/2018   No Known Allergies Current Outpatient Medications on File Prior to Visit  Medication Sig Dispense Refill  . acetaminophen (TYLENOL) 160 MG/5ML liquid Take 20 mLs (640 mg total) by mouth every 6 (six) hours as needed for fever. (Patient not taking: Reported on 01/19/2019) 120 mL 1  . calcium carbonate (TUMS) 500 MG chewable tablet Chew 1 tablet (200 mg of elemental calcium total) by mouth 2 (two) times daily as  needed for indigestion or heartburn. (Patient not taking: Reported on 01/19/2019) 45 tablet 2  . ondansetron (ZOFRAN ODT) 4 MG disintegrating tablet Take 1 tablet (4 mg total) by mouth every 8 (eight) hours as needed for nausea or vomiting. (Patient not taking: Reported on 01/19/2019) 20 tablet 0  . Prenatal Vit-Fe Fumarate-FA (PRENATAL VITAMIN PO) Take by mouth.     No current facility-administered medications on file prior to visit.     Review of Systems Pertinent items noted in HPI and remainder of comprehensive ROS otherwise negative.  Exam   Vitals:   01/19/19 1027  BP: 116/72  Pulse: 81  Weight: 203 lb (92.1 kg)   Fetal Heart Rate (bpm): 142  System: General: well-developed, well-nourished female in no acute distress   Breasts:  normal appearance, no masses or tenderness bilaterally   Skin: normal coloration and turgor, no rashes   Neurologic: oriented, normal, negative, normal mood   Extremities: normal strength, tone, and muscle mass, ROM of all joints is normal  HEENT PERRLA, extraocular movement intact and sclera clear, anicteric   Mouth/Teeth mucous membranes moist, pharynx normal without lesions and dental hygiene good   Neck supple and no masses   Cardiovascular: regular rate and rhythm   Respiratory:  no respiratory distress, normal breath sounds   Abdomen: soft, non-tender; bowel sounds normal; no masses,  no organomegaly   Assessment:   Pregnancy: [id number] Patient Active Problem List   Diagnosis Date Noted  . Supervision of low-risk pregnancy 01/19/2019  . Late prenatal care affecting pregnancy 01/19/2019  . Round ligament pain 01/19/2019  . History of marijuana use 01/19/2019  . Social problem 10/23/2017  . Abnormal cervical Papanicolaou smear affecting pregnancy in third trimester 08/20/2017     Plan:   1. Encounter for supervision of low-risk pregnancy in third trimester  - Ambulatory referral to [facility] - SMN1 Copy Number  Analysis - Cystic Fibrosis Mutation 97 - Hemoglobinopathy Evaluation - Genetic Screening - Could not stay for 2 hour GTT today, she is returning tomorrow AM for GTT  2. Round ligament pain  - Rx maternity support belt   3. Late prenatal care affecting pregnancy in third trimester  - Encouraged appointments  - @ 33 weeks   4. History of marijuana use  - POCT Urine Drug Screen  5. Abnormal cervical Papanicolaou smear affecting pregnancy in third trimester  - Will need PP pap   7. Social problem  - Will see Jamie    Initial labs drawn. Continue prenatal vitamins. Genetic Screening discussed, NIPS: requested. Ultrasound discussed; fetal anatomic survey: results reviewed. Problem list reviewed and updated. The nature of [facility] with multiple MDs and other Advanced Practice Providers was explained to patient; also emphasized that residents, students are part of our team. Routine obstetric precautions reviewed. Return in about 2 weeks (around 02/02/2019) for Return tomorrow for 2 hour GTT; come fasting .    Rasch, Harolyn Rutherford, NP  [facility] [employer], [facility]

## 2019-01-19 NOTE — Progress Notes (Signed)
New ob/28 wk packet given  tdap vaccine given

## 2019-01-20 ENCOUNTER — Other Ambulatory Visit: Payer: Self-pay | Admitting: *Deleted

## 2019-01-20 ENCOUNTER — Other Ambulatory Visit: Payer: Self-pay

## 2019-01-20 DIAGNOSIS — Z3493 Encounter for supervision of normal pregnancy, unspecified, third trimester: Secondary | ICD-10-CM

## 2019-01-21 LAB — GLUCOSE TOLERANCE, 2 HOURS W/ 1HR
Glucose, 1 hour: 101 mg/dL (ref 65–179)
Glucose, 2 hour: 86 mg/dL (ref 65–152)
Glucose, Fasting: 81 mg/dL (ref 65–91)

## 2019-01-30 ENCOUNTER — Encounter: Payer: Self-pay | Admitting: *Deleted

## 2019-02-01 LAB — HEMOGLOBINOPATHY EVALUATION
Ferritin: 12 ng/mL — ABNORMAL LOW (ref 15–150)
HGB A2 QUANT: 2.1 % (ref 1.8–3.2)
Hematocrit: 27.5 % — ABNORMAL LOW (ref 34.0–46.6)
Hemoglobin: 9 g/dL — ABNORMAL LOW (ref 11.1–15.9)
Hgb A: 97.9 % (ref 96.4–98.8)
Hgb C: 0 %
Hgb F Quant: 0 % (ref 0.0–2.0)
Hgb S: 0 %
Hgb Solubility: NEGATIVE
Hgb Variant: 0 %
MCH: 26.9 pg (ref 26.6–33.0)
MCHC: 32.7 g/dL (ref 31.5–35.7)
MCV: 82 fL (ref 79–97)
Platelets: 168 10*3/uL (ref 150–450)
RBC: 3.35 x10E6/uL — ABNORMAL LOW (ref 3.77–5.28)
RDW: 13.7 % (ref 11.7–15.4)
WBC: 6.9 10*3/uL (ref 3.4–10.8)

## 2019-02-01 LAB — INHERITEST(R) CF/SMA PANEL

## 2019-02-02 ENCOUNTER — Encounter: Payer: Self-pay | Admitting: Obstetrics and Gynecology

## 2019-02-02 DIAGNOSIS — O99019 Anemia complicating pregnancy, unspecified trimester: Secondary | ICD-10-CM | POA: Insufficient documentation

## 2019-02-06 ENCOUNTER — Ambulatory Visit (INDEPENDENT_AMBULATORY_CARE_PROVIDER_SITE_OTHER): Payer: Self-pay | Admitting: Nurse Practitioner

## 2019-02-06 VITALS — BP 104/69 | HR 96 | Wt 206.0 lb

## 2019-02-06 DIAGNOSIS — O99013 Anemia complicating pregnancy, third trimester: Secondary | ICD-10-CM

## 2019-02-06 DIAGNOSIS — Z113 Encounter for screening for infections with a predominantly sexual mode of transmission: Secondary | ICD-10-CM

## 2019-02-06 DIAGNOSIS — Z3493 Encounter for supervision of normal pregnancy, unspecified, third trimester: Secondary | ICD-10-CM

## 2019-02-06 NOTE — Patient Instructions (Signed)

## 2019-02-06 NOTE — Progress Notes (Signed)
    Subjective:  Heather Kim is a 33 y.o. [id number] at [redacted]w[redacted]d being seen today for ongoing prenatal care.  She is currently monitored for the following issues for this low-risk pregnancy and has Abnormal cervical Papanicolaou smear affecting pregnancy in third trimester; Social problem; Supervision of low-risk pregnancy; Late prenatal care affecting pregnancy; Round ligament pain; History of marijuana use; and Anemia in pregnancy on their problem list.  Still trying to get her Medicaid activated.  Considering BTL or IUD - unable to sign papers today as she does not yet have Medicaid for this pregnancy.  Patient reports no complaints.  Contractions: Irritability. Vag. Bleeding: None.  Movement: Present. Denies leaking of fluid.   The following portions of the patient's history were reviewed and updated as appropriate: allergies, current medications, past family history, past medical history, past social history, past surgical history and problem list. Problem list updated.  Objective:   Vitals:   02/06/19 1154  BP: 104/69  Pulse: 96  Weight: 206 lb (93.4 kg)    Fetal Status: Fetal Heart Rate (bpm): 141 Fundal Height: 36 cm Movement: Present  Presentation: Vertex  General:  Alert, oriented and cooperative. Patient is in no acute distress.  Skin: Skin is warm and dry. No rash noted.   Cardiovascular: Normal heart rate noted  Respiratory: Normal respiratory effort, no problems with respiration noted  Abdomen: Soft, gravid, appropriate for gestational age. Pain/Pressure: Present     Pelvic:  Cervical exam performed Dilation: Closed      Extremities: Normal range of motion.  Edema: None  Mental Status: Normal mood and affect. Normal behavior. Normal judgment and thought content.   Urinalysis:      Assessment and Plan:  Pregnancy: [id number] at [redacted]w[redacted]d  1. Encounter for supervision of low-risk pregnancy in third trimester Will likely get an IUD - strongly encouraged to come to her postpartum  appointment - Culture, beta strep (group b only) - GC/Chlamydia probe amp ()not at [facility]  2. Anemia during pregnancy in third trimester Still planning to get OTC iron supplements.  Encouraged to take these.  Unable to prescribe as Medicaid is not yet active.  Preterm labor symptoms and general obstetric precautions including but not limited to vaginal bleeding, contractions, leaking of fluid and fetal movement were reviewed in detail with the patient. Please refer to After Visit Summary for other counseling recommendations.  Return in about 1 week (around 02/13/2019).  Nolene Bernheim, RN, MSN, NP-BC Nurse Practitioner, [facility] for [employer], [facility] 02/06/2019 12:07 PM

## 2019-02-07 LAB — GC/CHLAMYDIA PROBE AMP (~~LOC~~) NOT AT ARMC
Chlamydia: NEGATIVE
Neisseria Gonorrhea: NEGATIVE

## 2019-02-09 LAB — CULTURE, BETA STREP (GROUP B ONLY): Strep Gp B Culture: POSITIVE — AB

## 2019-02-13 ENCOUNTER — Encounter: Payer: Self-pay | Admitting: Internal Medicine

## 2019-02-13 ENCOUNTER — Encounter: Payer: Self-pay | Admitting: *Deleted

## 2019-02-13 ENCOUNTER — Encounter: Payer: Self-pay | Admitting: Nurse Practitioner

## 2019-02-13 ENCOUNTER — Telehealth: Payer: Self-pay | Admitting: Internal Medicine

## 2019-02-13 DIAGNOSIS — B951 Streptococcus, group B, as the cause of diseases classified elsewhere: Secondary | ICD-10-CM | POA: Insufficient documentation

## 2019-02-13 DIAGNOSIS — O98819 Other maternal infectious and parasitic diseases complicating pregnancy, unspecified trimester: Secondary | ICD-10-CM

## 2019-02-13 NOTE — Telephone Encounter (Signed)
Called the patient to inform of missed appointment. Left a voicemail message educating the patient to call our clinic. Also sending a reminder letter.

## 2019-02-22 ENCOUNTER — Other Ambulatory Visit: Payer: Self-pay

## 2019-02-22 ENCOUNTER — Ambulatory Visit (INDEPENDENT_AMBULATORY_CARE_PROVIDER_SITE_OTHER): Payer: Self-pay | Admitting: Medical

## 2019-02-22 ENCOUNTER — Encounter: Payer: Self-pay | Admitting: Medical

## 2019-02-22 VITALS — BP 113/65 | HR 81 | Wt 212.7 lb

## 2019-02-22 DIAGNOSIS — Z3A38 38 weeks gestation of pregnancy: Secondary | ICD-10-CM

## 2019-02-22 DIAGNOSIS — R87619 Unspecified abnormal cytological findings in specimens from cervix uteri: Secondary | ICD-10-CM

## 2019-02-22 DIAGNOSIS — B951 Streptococcus, group B, as the cause of diseases classified elsewhere: Secondary | ICD-10-CM

## 2019-02-22 DIAGNOSIS — O0933 Supervision of pregnancy with insufficient antenatal care, third trimester: Secondary | ICD-10-CM

## 2019-02-22 DIAGNOSIS — Z3493 Encounter for supervision of normal pregnancy, unspecified, third trimester: Secondary | ICD-10-CM

## 2019-02-22 DIAGNOSIS — O98813 Other maternal infectious and parasitic diseases complicating pregnancy, third trimester: Secondary | ICD-10-CM

## 2019-02-22 DIAGNOSIS — O98819 Other maternal infectious and parasitic diseases complicating pregnancy, unspecified trimester: Secondary | ICD-10-CM

## 2019-02-22 DIAGNOSIS — O3443 Maternal care for other abnormalities of cervix, third trimester: Secondary | ICD-10-CM

## 2019-02-22 NOTE — Progress Notes (Signed)
Pt states is having a lot swelling in legs, feet & ankles, drinks plenty of water & elevates them during the day.

## 2019-02-22 NOTE — Progress Notes (Signed)
   PRENATAL VISIT NOTE  Subjective:  Heather Kim is a 33 y.o. [id number] at [redacted]w[redacted]d being seen today for ongoing prenatal care.  She is currently monitored for the following issues for this high-risk pregnancy and has Abnormal cervical Papanicolaou smear affecting pregnancy in third trimester; Social problem; Supervision of low-risk pregnancy; Late prenatal care affecting pregnancy; Round ligament pain; History of marijuana use; Anemia in pregnancy; and Group B streptococcal infection during pregnancy on their problem list.  Patient reports backache and occasional contractions.  Contractions: Irregular. Vag. Bleeding: None.  Movement: Present. Denies leaking of fluid.   The following portions of the patient's history were reviewed and updated as appropriate: allergies, current medications, past family history, past medical history, past social history, past surgical history and problem list. Problem list updated.  Objective:   Vitals:   02/22/19 1316  BP: 113/65  Pulse: 81  Weight: 212 lb 11.2 oz (96.5 kg)    Fetal Status: Fetal Heart Rate (bpm): 130 Fundal Height: 38 cm Movement: Present     General:  Alert, oriented and cooperative. Patient is in no acute distress.  Skin: Skin is warm and dry. No rash noted.   Cardiovascular: Normal heart rate noted  Respiratory: Normal respiratory effort, no problems with respiration noted  Abdomen: Soft, gravid, appropriate for gestational age.  Pain/Pressure: Present     Pelvic: Cervical exam deferred        Extremities: Normal range of motion.  Edema: Trace  Mental Status: Normal mood and affect. Normal behavior. Normal judgment and thought content.   Assessment and Plan:  Pregnancy: W9V9480 at [redacted]w[redacted]d  1. Encounter for supervision of low-risk pregnancy in third trimester  2. Late prenatal care affecting pregnancy in third trimester  3. Group B streptococcal infection during pregnancy - Discussed with patient, treat in labor  4. Abnormal  cervical Papanicolaou smear affecting pregnancy in third trimester - Needs PP pap  Term labor symptoms and general obstetric precautions including but not limited to vaginal bleeding, contractions, leaking of fluid and fetal movement were reviewed in detail with the patient. Please refer to After Visit Summary for other counseling recommendations.  Return in about 1 week (around 03/01/2019) for LOB.  Future Appointments  Date Time Provider Department Center  03/01/2019  1:15 PM Currie Paris, NP [facility]    Vonzella Nipple, PA-C

## 2019-02-22 NOTE — Patient Instructions (Signed)

## 2019-03-01 ENCOUNTER — Other Ambulatory Visit: Payer: Self-pay

## 2019-03-01 ENCOUNTER — Ambulatory Visit (INDEPENDENT_AMBULATORY_CARE_PROVIDER_SITE_OTHER): Payer: Medicaid Other | Admitting: Medical

## 2019-03-01 ENCOUNTER — Encounter: Payer: Self-pay | Admitting: Medical

## 2019-03-01 ENCOUNTER — Telehealth: Payer: Self-pay

## 2019-03-01 VITALS — BP 119/73 | HR 76 | Wt 212.2 lb

## 2019-03-01 DIAGNOSIS — O98813 Other maternal infectious and parasitic diseases complicating pregnancy, third trimester: Secondary | ICD-10-CM | POA: Diagnosis not present

## 2019-03-01 DIAGNOSIS — O0933 Supervision of pregnancy with insufficient antenatal care, third trimester: Secondary | ICD-10-CM | POA: Diagnosis not present

## 2019-03-01 DIAGNOSIS — Z3493 Encounter for supervision of normal pregnancy, unspecified, third trimester: Secondary | ICD-10-CM

## 2019-03-01 DIAGNOSIS — B951 Streptococcus, group B, as the cause of diseases classified elsewhere: Secondary | ICD-10-CM

## 2019-03-01 DIAGNOSIS — O98819 Other maternal infectious and parasitic diseases complicating pregnancy, unspecified trimester: Secondary | ICD-10-CM

## 2019-03-01 DIAGNOSIS — Z3A39 39 weeks gestation of pregnancy: Secondary | ICD-10-CM | POA: Diagnosis not present

## 2019-03-01 NOTE — Patient Instructions (Signed)
Fetal Movement Counts Patient Name: ________________________________________________ Patient Due Date: ____________________ What is a fetal movement count?  A fetal movement count is the number of times that you feel your baby move during a certain amount of time. This may also be called a fetal kick count. A fetal movement count is recommended for every pregnant woman. You may be asked to start counting fetal movements as early as week 28 of your pregnancy. Pay attention to when your baby is most active. You may notice your baby's sleep and wake cycles. You may also notice things that make your baby move more. You should do a fetal movement count:  When your baby is normally most active.  At the same time each day. A good time to count movements is while you are resting, after having something to eat and drink. How do I count fetal movements? 1. Find a quiet, comfortable area. Sit, or lie down on your side. 2. Write down the date, the start time and stop time, and the number of movements that you felt between those two times. Take this information with you to your health care visits. 3. For 2 hours, count kicks, flutters, swishes, rolls, and jabs. You should feel at least 10 movements during 2 hours. 4. You may stop counting after you have felt 10 movements. 5. If you do not feel 10 movements in 2 hours, have something to eat and drink. Then, keep resting and counting for 1 hour. If you feel at least 4 movements during that hour, you may stop counting. Contact a health care provider if:  You feel fewer than 4 movements in 2 hours.  Your baby is not moving like he or she usually does. Date: ____________ Start time: ____________ Stop time: ____________ Movements: ____________ Date: ____________ Start time: ____________ Stop time: ____________ Movements: ____________ Date: ____________ Start time: ____________ Stop time: ____________ Movements: ____________ Date: ____________ Start time:  ____________ Stop time: ____________ Movements: ____________ Date: ____________ Start time: ____________ Stop time: ____________ Movements: ____________ Date: ____________ Start time: ____________ Stop time: ____________ Movements: ____________ Date: ____________ Start time: ____________ Stop time: ____________ Movements: ____________ Date: ____________ Start time: ____________ Stop time: ____________ Movements: ____________ Date: ____________ Start time: ____________ Stop time: ____________ Movements: ____________ This information is not intended to replace advice given to you by your health care provider. Make sure you discuss any questions you have with your health care provider. Document Released: 01/13/2007 Document Revised: 08/12/2016 Document Reviewed: 01/23/2016 Elsevier Interactive Patient Education  2019 [employer] Braxton Hicks Contractions Contractions of the uterus can occur throughout pregnancy, but they are not always a sign that you are in labor. You may have practice contractions called Braxton Hicks contractions. These false labor contractions are sometimes confused with true labor. What are Braxton Hicks contractions? Braxton Hicks contractions are tightening movements that occur in the muscles of the uterus before labor. Unlike true labor contractions, these contractions do not result in opening (dilation) and thinning of the cervix. Toward the end of pregnancy (32-34 weeks), Braxton Hicks contractions can happen more often and may become stronger. These contractions are sometimes difficult to tell apart from true labor because they can be very uncomfortable. You should not feel embarrassed if you go to the hospital with false labor. Sometimes, the only way to tell if you are in true labor is for your health care provider to look for changes in the cervix. The health care provider will do a physical exam and may monitor your contractions. If   you are not in true labor, the exam  should show that your cervix is not dilating and your water has not broken. If there are no other health problems associated with your pregnancy, it is completely safe for you to be sent home with false labor. You may continue to have Braxton Hicks contractions until you go into true labor. How to tell the difference between true labor and false labor True labor  Contractions last 30-70 seconds.  Contractions become very regular.  Discomfort is usually felt in the top of the uterus, and it spreads to the lower abdomen and low back.  Contractions do not go away with walking.  Contractions usually become more intense and increase in frequency.  The cervix dilates and gets thinner. False labor  Contractions are usually shorter and not as strong as true labor contractions.  Contractions are usually irregular.  Contractions are often felt in the front of the lower abdomen and in the groin.  Contractions may go away when you walk around or change positions while lying down.  Contractions get weaker and are shorter-lasting as time goes on.  The cervix usually does not dilate or become thin. Follow these instructions at home:   Take over-the-counter and prescription medicines only as told by your health care provider.  Keep up with your usual exercises and follow other instructions from your health care provider.  Eat and drink lightly if you think you are going into labor.  If Braxton Hicks contractions are making you uncomfortable: ? Change your position from lying down or resting to walking, or change from walking to resting. ? Sit and rest in a tub of warm water. ? Drink enough fluid to keep your urine pale yellow. Dehydration may cause these contractions. ? Do slow and deep breathing several times an hour.  Keep all follow-up prenatal visits as told by your health care provider. This is important. Contact a health care provider if:  You have a fever.  You have continuous  pain in your abdomen. Get help right away if:  Your contractions become stronger, more regular, and closer together.  You have fluid leaking or gushing from your vagina.  You pass blood-tinged mucus (bloody show).  You have bleeding from your vagina.  You have low back pain that you never had before.  You feel your baby's head pushing down and causing pelvic pressure.  Your baby is not moving inside you as much as it used to. Summary  Contractions that occur before labor are called Braxton Hicks contractions, false labor, or practice contractions.  Braxton Hicks contractions are usually shorter, weaker, farther apart, and less regular than true labor contractions. True labor contractions usually become progressively stronger and regular, and they become more frequent.  Manage discomfort from Braxton Hicks contractions by changing position, resting in a warm bath, drinking plenty of water, or practicing deep breathing. This information is not intended to replace advice given to you by your health care provider. Make sure you discuss any questions you have with your health care provider. Document Released: 04/29/2017 Document Revised: 09/28/2017 Document Reviewed: 04/29/2017 Elsevier Interactive Patient Education  2019 Elsevier Inc.  

## 2019-03-01 NOTE — Progress Notes (Signed)
   PRENATAL VISIT NOTE  Subjective:  Heather Kim is a 33 y.o. H4V4259 at [redacted]w[redacted]d being seen today for ongoing prenatal care.  She is currently monitored for the following issues for this low-risk pregnancy and has Abnormal cervical Papanicolaou smear affecting pregnancy in third trimester; Social problem; Supervision of low-risk pregnancy; Late prenatal care affecting pregnancy; Round ligament pain; History of marijuana use; Anemia in pregnancy; and Group B streptococcal infection during pregnancy on their problem list.  Patient reports occasional contractions.  Contractions: Irregular. Vag. Bleeding: None.  Movement: Present. Denies leaking of fluid.   The following portions of the patient's history were reviewed and updated as appropriate: allergies, current medications, past family history, past medical history, past social history, past surgical history and problem list. Problem list updated.  Objective:   Vitals:   03/01/19 1320  BP: 119/73  Pulse: 76  Weight: 212 lb 3.2 oz (96.3 kg)    Fetal Status: Fetal Heart Rate (bpm): 147 Fundal Height: 38 cm Movement: Present  Presentation: Vertex  General:  Alert, oriented and cooperative. Patient is in no acute distress.  Skin: Skin is warm and dry. No rash noted.   Cardiovascular: Normal heart rate noted  Respiratory: Normal respiratory effort, no problems with respiration noted  Abdomen: Soft, gravid, appropriate for gestational age.  Pain/Pressure: Present     Pelvic: Cervical exam performed Dilation: 1 Effacement (%): 50 Station: -2  Extremities: Normal range of motion.  Edema: Trace  Mental Status: Normal mood and affect. Normal behavior. Normal judgment and thought content.   Assessment and Plan:  Pregnancy: [id number] at [redacted]w[redacted]d  1. Encounter for supervision of low-risk pregnancy in third trimester - IOL scheduled   2. Group B streptococcal infection during pregnancy - Treat in labor  3. Late prenatal care affecting pregnancy  in third trimester  Term labor symptoms and general obstetric precautions including but not limited to vaginal bleeding, contractions, leaking of fluid and fetal movement were reviewed in detail with the patient. Please refer to After Visit Summary for other counseling recommendations.  Return in about 1 week (around 03/08/2019) for LOB, NST/BPP.  Vonzella Nipple, PA-C

## 2019-03-02 NOTE — Telephone Encounter (Signed)
error 

## 2019-03-07 ENCOUNTER — Encounter (HOSPITAL_COMMUNITY): Payer: Self-pay

## 2019-03-07 ENCOUNTER — Inpatient Hospital Stay (HOSPITAL_COMMUNITY): Payer: Medicaid Other

## 2019-03-07 ENCOUNTER — Inpatient Hospital Stay (HOSPITAL_COMMUNITY)
Admission: AD | Admit: 2019-03-07 | Discharge: 2019-03-09 | DRG: 806 | Disposition: A | Discharge status: Home / Self Care | Payer: Medicaid Other | Attending: Obstetrics and Gynecology | Admitting: Obstetrics and Gynecology

## 2019-03-07 DIAGNOSIS — Z3A40 40 weeks gestation of pregnancy: Secondary | ICD-10-CM

## 2019-03-07 DIAGNOSIS — O9902 Anemia complicating childbirth: Secondary | ICD-10-CM | POA: Diagnosis present

## 2019-03-07 DIAGNOSIS — O26893 Other specified pregnancy related conditions, third trimester: Secondary | ICD-10-CM | POA: Diagnosis present

## 2019-03-07 DIAGNOSIS — D649 Anemia, unspecified: Secondary | ICD-10-CM | POA: Diagnosis present

## 2019-03-07 DIAGNOSIS — F1290 Cannabis use, unspecified, uncomplicated: Secondary | ICD-10-CM | POA: Diagnosis present

## 2019-03-07 DIAGNOSIS — O0933 Supervision of pregnancy with insufficient antenatal care, third trimester: Secondary | ICD-10-CM

## 2019-03-07 DIAGNOSIS — O99324 Drug use complicating childbirth: Secondary | ICD-10-CM | POA: Diagnosis present

## 2019-03-07 DIAGNOSIS — O99824 Streptococcus B carrier state complicating childbirth: Secondary | ICD-10-CM | POA: Diagnosis present

## 2019-03-07 DIAGNOSIS — O480 Post-term pregnancy: Secondary | ICD-10-CM | POA: Diagnosis present

## 2019-03-07 DIAGNOSIS — R1011 Right upper quadrant pain: Secondary | ICD-10-CM

## 2019-03-07 DIAGNOSIS — O99013 Anemia complicating pregnancy, third trimester: Secondary | ICD-10-CM

## 2019-03-07 DIAGNOSIS — Z3493 Encounter for supervision of normal pregnancy, unspecified, third trimester: Secondary | ICD-10-CM

## 2019-03-07 LAB — CBC
HCT: 24.8 % — ABNORMAL LOW (ref 36.0–46.0)
Hemoglobin: 7.7 g/dL — ABNORMAL LOW (ref 12.0–15.0)
MCH: 26 pg (ref 26.0–34.0)
MCHC: 31 g/dL (ref 30.0–36.0)
MCV: 83.8 fL (ref 80.0–100.0)
PLATELETS: 175 10*3/uL (ref 150–400)
RBC: 2.96 MIL/uL — ABNORMAL LOW (ref 3.87–5.11)
RDW: 14.4 % (ref 11.5–15.5)
WBC: 9.6 10*3/uL (ref 4.0–10.5)
nRBC: 0 % (ref 0.0–0.2)

## 2019-03-07 LAB — TYPE AND SCREEN
ABO/RH(D): O POS
Antibody Screen: NEGATIVE

## 2019-03-07 LAB — RPR: RPR Ser Ql: NONREACTIVE

## 2019-03-07 LAB — ABO/RH: ABO/RH(D): O POS

## 2019-03-07 MED ORDER — DIBUCAINE 1 % RE OINT: 1.0000 "application " | TOPICAL_OINTMENT | RECTAL | Status: DC | PRN

## 2019-03-07 MED ORDER — WITCH HAZEL-GLYCERIN EX PADS: 1.0000 "application " | MEDICATED_PAD | CUTANEOUS | Status: DC | PRN

## 2019-03-07 MED ORDER — ONDANSETRON HCL 4 MG/2ML IJ SOLN: 4.0000 mg | INTRAMUSCULAR | Status: DC | PRN

## 2019-03-07 MED ORDER — COCONUT OIL OIL: 1.0000 "application " | TOPICAL_OIL | Status: DC | PRN

## 2019-03-07 MED ORDER — OXYCODONE-ACETAMINOPHEN 5-325 MG PO TABS
1.0000 | ORAL_TABLET | Freq: Four times a day (QID) | ORAL | Status: DC | PRN
  Administered 2019-03-07: 2 via ORAL
  Filled 2019-03-07: qty 2

## 2019-03-07 MED ORDER — LIDOCAINE 5 % EX PTCH
1.0000 | MEDICATED_PATCH | CUTANEOUS | Status: DC
  Filled 2019-03-07 (×3): qty 1

## 2019-03-07 MED ORDER — ONDANSETRON HCL 4 MG/2ML IJ SOLN: 4.0000 mg | Freq: Four times a day (QID) | INTRAMUSCULAR | Status: DC | PRN

## 2019-03-07 MED ORDER — TETANUS-DIPHTH-ACELL PERTUSSIS 5-2.5-18.5 LF-MCG/0.5 IM SUSP: 0.5000 mL | Freq: Once | INTRAMUSCULAR | Status: DC

## 2019-03-07 MED ORDER — ACETAMINOPHEN 325 MG PO TABS
650.0000 mg | ORAL_TABLET | ORAL | Status: DC | PRN
  Administered 2019-03-07 (×2): 650 mg via ORAL
  Filled 2019-03-07 (×2): qty 2

## 2019-03-07 MED ORDER — TERBUTALINE SULFATE 1 MG/ML IJ SOLN: 0.2500 mg | Freq: Once | INTRAMUSCULAR | Status: DC | PRN

## 2019-03-07 MED ORDER — OXYTOCIN 40 UNITS IN NORMAL SALINE INFUSION - SIMPLE MED: 2.5000 [IU]/h | INTRAVENOUS | Status: DC

## 2019-03-07 MED ORDER — LACTATED RINGERS IV SOLN: INTRAVENOUS | Status: DC

## 2019-03-07 MED ORDER — SENNOSIDES-DOCUSATE SODIUM 8.6-50 MG PO TABS
2.0000 | ORAL_TABLET | ORAL | Status: DC
  Administered 2019-03-08: 2 via ORAL
  Filled 2019-03-07 (×2): qty 2

## 2019-03-07 MED ORDER — FERROUS SULFATE 325 (65 FE) MG PO TABS
325.0000 mg | ORAL_TABLET | Freq: Two times a day (BID) | ORAL | Status: DC
  Administered 2019-03-07 – 2019-03-09 (×5): 325 mg via ORAL
  Filled 2019-03-07 (×5): qty 1

## 2019-03-07 MED ORDER — OXYTOCIN 40 UNITS IN NORMAL SALINE INFUSION - SIMPLE MED
INTRAVENOUS | Status: AC
  Administered 2019-03-07: 999 mL/h via INTRAVENOUS
  Filled 2019-03-07: qty 1000

## 2019-03-07 MED ORDER — LACTATED RINGERS IV SOLN: 500.0000 mL | INTRAVENOUS | Status: DC | PRN

## 2019-03-07 MED ORDER — MISOPROSTOL 25 MCG QUARTER TABLET: 25.0000 ug | ORAL_TABLET | ORAL | Status: DC | PRN

## 2019-03-07 MED ORDER — OXYTOCIN BOLUS FROM INFUSION
500.0000 mL | Freq: Once | INTRAVENOUS | Status: DC
  Administered 2019-03-07: 999 mL/h via INTRAVENOUS

## 2019-03-07 MED ORDER — ACETAMINOPHEN 325 MG PO TABS: 650.0000 mg | ORAL_TABLET | ORAL | Status: DC | PRN

## 2019-03-07 MED ORDER — LIDOCAINE HCL (PF) 1 % IJ SOLN: 30.0000 mL | INTRAMUSCULAR | Status: DC | PRN

## 2019-03-07 MED ORDER — DIPHENHYDRAMINE HCL 25 MG PO CAPS: 25.0000 mg | ORAL_CAPSULE | Freq: Four times a day (QID) | ORAL | Status: DC | PRN

## 2019-03-07 MED ORDER — ONDANSETRON HCL 4 MG PO TABS: 4.0000 mg | ORAL_TABLET | ORAL | Status: DC | PRN

## 2019-03-07 MED ORDER — SIMETHICONE 80 MG PO CHEW: 80.0000 mg | CHEWABLE_TABLET | ORAL | Status: DC | PRN

## 2019-03-07 MED ORDER — FLEET ENEMA 7-19 GM/118ML RE ENEM: 1.0000 | ENEMA | RECTAL | Status: DC | PRN

## 2019-03-07 MED ORDER — PRENATAL MULTIVITAMIN CH
1.0000 | ORAL_TABLET | Freq: Every day | ORAL | Status: DC
  Administered 2019-03-07 – 2019-03-09 (×3): 1 via ORAL
  Filled 2019-03-07 (×3): qty 1

## 2019-03-07 MED ORDER — BENZOCAINE-MENTHOL 20-0.5 % EX AERO: 1.0000 "application " | INHALATION_SPRAY | CUTANEOUS | Status: DC | PRN

## 2019-03-07 MED ORDER — OXYCODONE-ACETAMINOPHEN 5-325 MG PO TABS: 1.0000 | ORAL_TABLET | ORAL | Status: DC | PRN

## 2019-03-07 MED ORDER — SOD CITRATE-CITRIC ACID 500-334 MG/5ML PO SOLN: 30.0000 mL | ORAL | Status: DC | PRN

## 2019-03-07 MED ORDER — ZOLPIDEM TARTRATE 5 MG PO TABS: 5.0000 mg | ORAL_TABLET | Freq: Every evening | ORAL | Status: DC | PRN

## 2019-03-07 MED ORDER — IBUPROFEN 600 MG PO TABS
600.0000 mg | ORAL_TABLET | Freq: Four times a day (QID) | ORAL | Status: DC
  Administered 2019-03-07 – 2019-03-09 (×10): 600 mg via ORAL
  Filled 2019-03-07 (×10): qty 1

## 2019-03-07 MED ORDER — OXYCODONE-ACETAMINOPHEN 5-325 MG PO TABS: 2.0000 | ORAL_TABLET | ORAL | Status: DC | PRN

## 2019-03-07 NOTE — Progress Notes (Signed)
CSW acknowledges consult. CSW attempted to meet with MOB, however MOB reported she was about to rest. MOB agreeable to meet with CSW later today.   Carliss Porcaro Irwin, LCSWA  [facility] [phone]  

## 2019-03-07 NOTE — MAU Provider Note (Signed)
Called CNM to room to evaluate patient's bleeding when doing a fundal check. Weighed pads 983cc. IV Pitocin ordered. Patient's BP stable. Repeat fundal checks revealed deviated uterus d/t full bladder. Patient unable to void, so provider ordered ISC.  300cc urine returned on ISC. Fundus midline after this. Patient transferred in stable condition to Mother-Baby.

## 2019-03-07 NOTE — Lactation Note (Signed)
This note was copied from a baby's chart. Lactation Consultation Note  Patient Name: Heather Kim [id number] Date: 03/07/2019 Reason for consult: Initial assessment;Term  Visited with P5 Mom of term baby at 28 hrs old.  Mom states baby latches well, without difficulty.  Mom resting in bed and baby swaddled in crib.  Mom breastfed her last 4 babies without any difficulty.  Reminded her of importance of STS, and feeding baby often when she cues.   Mom to call prn for any concerns or need for assistance. Lactation brochure left in room.  Mom aware of IP and OP lactation support available to her.   Consult Status Consult Status: Follow-up Date: 03/08/19 Follow-up type: In-patient    Heather Kim 03/07/2019, 1:37 PM

## 2019-03-07 NOTE — Clinical Social Work Maternal (Signed)
CLINICAL SOCIAL WORK MATERNAL/CHILD NOTE  Patient Details  Name: Heather Kim MRN: [id number] Date of Birth: 02/23/1986  Date:  May 03, 2019  Clinical Social Worker Initiating Note:  Ollen Barges Date/Time: Initiated:  2019-07-09/1436     Child's Name:  Clint Lipps   Biological Parents:  Mother, Father(Loriel Annette Stable and Carlesha Seiple (DOB: 02/19/1978))   Need for Interpreter:  None   Reason for Referral:  Current Substance Use/Substance Use During Pregnancy , Late or No Prenatal Care , Behavioral Health Concerns   Address:  [place]  [address]    Phone number:  [phone] (home)     Additional phone number:   Household Members/Support Persons (HM/SP):   Household Member/Support Person 1, Household Member/Support Person 3, Household Member/Support Person 2, Household Member/Support Person 4   HM/SP Name Relationship DOB or Age  HM/SP -1 Nevin Kozuch Daughter 11/01/2009  HM/SP -2 Zayvier Betzabeth Derringer 03/24/2013  HM/SP -3 Ky'yon Dajanee Voorheis 05/31/2016  HM/SP -4 Ayrrion Lovena Le Daughter 10/21/2017  HM/SP -5        HM/SP -6        HM/SP -7        HM/SP -8          Natural Supports (not living in the home):  Immediate Family(MOB stated her twin sister is her main source of support)   Professional Supports: Other (Comment)([employer] [facility]- has Higher education careers adviser)   Employment: Unemployed   Type of Work:     Education:  [place] arranged:    Museum/gallery curator Resources:  Medicaid(Medicaid Medical illustrator Counseling attempted to meet with MOB during assessment, stated they would return at a later time.)   Other Resources:  Physicist, medical , [facility]   Cultural/Religious Considerations Which May Impact Care:   Strengths:  Ability to meet basic needs , Home prepared for child , Pediatrician chosen   Psychotropic Medications:         Pediatrician:    [place]olicitor area  Pediatrician List:   [facility] ([address])  [place]      Pediatrician Fax Number:    Risk Factors/Current Problems:  Transportation , Family/Relationship Issues , Substance Use , Mental Health Concerns    Cognitive State:  Able to Concentrate , Alert , Insightful    Mood/Affect:  Calm , Comfortable , Interested , Relaxed    CSW Assessment: CSW received consult for MOB being late to [facility], hx of THC use and hx of depression.  CSW met with MOB to offer support and complete assessment.    MOB holding infant to chest, with FOB present in the room upon CSW's entrance. CSW introduced self and role and received verbal permission to ask that FOB step out of the room while CSW completed assessment. FOB left voluntarily. CSW explained reason for consult and MOB expressed understanding and was open and engaged throughout assessment. MOB reported she is currently residing in the home with just herself and her 4 other children. MOB stated she is not currently employed and reported her highest grade completed was some college. MOB stated she currently receives [facility] and [employer].   CSW inquired about MOB's mental health history. MOB stated she is currently dealing with depression and has been since she was pregnant with her last child back in 2018. MOB reported  her symptoms as not wanting to go outside, not wanting to eat, and having racing thoughts. MOB reported little improvement in symptoms but stated she has been able to get outside more. MOB denied taking medication for her depression but did inform CSW that she has a therapist through [employer] that comes out to her home and visits with her weekly. Per MOB, she last met with her therapist on 3/5 and has a follow up appointment scheduled for next week. MOB reported she is currently feeling "ok" and denied any SI/HI or DV in the home. MOB reported that she  takes walks and talks to her twin sister to help cope with her symptoms. CSW provided education regarding the baby blues period vs. perinatal mood disorders, discussed treatment and gave resources for mental health follow up if concerns arise.  CSW recommends self-evaluation during the postpartum time period using the New Mom Checklist from Postpartum Progress and encouraged MOB to contact a medical professional if symptoms are noted at any time.  CSW inquired about MOB's late [place] to which MOB stated she didn't find out she was pregnant until she was 24 weeks. Per MOB, she battled with whether she was going to have baby or not. MOB reported she realized she needed to go to the doctor to make sure baby was ok but that there was some confusion on whether the hospital was going to schedule the appointment or she needed to. MOB also stated transportation was a barrier to getting to appointments. CSW inquired about if there would be any barriers to getting infant to pediatric appointments. MOB stated [facility] will be providing MOB with bus passes to ensure she makes it to any follow up appointments.   CSW inquired about MOB's substance use history. MOB open and honest and stated she has a history of THC use. MOB reported her last use was at [redacted] weeks pregnant to help with her excessive worrying. CSW explained [place] and informed MOB that UDS and CDS were still pending and that a CPS report would be made if warranted. MOB expressed understanding and denied any questions or concerns.    MOB stated CPS was involved back in 2017 when her last child's drug test came back positive for marijuana. MOB stated CPS case has been closed.   CSW inquired about where infant would sleep once discharged. MOB stated she would like to get infant a basinet but that she has co-sleeper that baby can sleep in for the time being. CSW provided review of Sudden Infant Death Syndrome (SIDS) precautions.    MOB denied any  further questions or concerns for CSW. CSW identifies no further need for intervention and no barriers to discharge at this time.   CSW Plan/Description:  No Further Intervention Required/No Barriers to Discharge, Sudden Infant Death Syndrome (SIDS) Education, Perinatal Mood and Anxiety Disorder (PMADs) Education, [place], CSW Will Continue to Monitor Umbilical Cord Tissue Drug Screen Results and Make Report if [place], [place] 23-Oct-2019, 3:34 PM

## 2019-03-07 NOTE — H&P (Signed)
LABOR AND DELIVERY ADMISSION HISTORY AND PHYSICAL NOTE  Heather Kim is a 33 y.o. female [id number] with IUP at [redacted]w[redacted]d by LMP presenting for SOL and SROM @0140  on EMS truck.  She reports positive fetal movement. Feels the urge to push upon arrival to MAU.   Prenatal History/Complications: PNC at [facility] Pregnancy complications:  - Late to prenatal care - Hx of marijuana use  - GBS during pregnancy  - Anemia in pregnancy  - Social problem   Past Medical History: Past Medical History:  Diagnosis Date  . Anxiety   . Depression   . Migraine headache     Past Surgical History: Past Surgical History:  Procedure Laterality Date  . NO PAST SURGERIES      Obstetrical History: OB History    Gravida  6   Para  4   Term  4   Preterm      AB  1   Living  4     SAB  1   TAB      Ectopic      Multiple  0   Live Births  4           Social History: Social History   Socioeconomic History  . Marital status: Single    Spouse name: Not on file  . Number of children: Not on file  . Years of education: Not on file  . Highest education level: Not on file  Occupational History  . Not on file  Social Needs  . Financial resource strain: Not on file  . Food insecurity:    Worry: Sometimes true    Inability: Sometimes true  . Transportation needs:    Medical: Yes    Non-medical: Yes  Tobacco Use  . Smoking status: Never Smoker  . Smokeless tobacco: Never Used  Substance and Sexual Activity  . Alcohol use: No  . Drug use: Yes    Types: Marijuana    Comment: last use 11/12/2018  . Sexual activity: Yes  Lifestyle  . Physical activity:    Days per week: Not on file    Minutes per session: Not on file  . Stress: Not on file  Relationships  . Social connections:    Talks on phone: Not on file    Gets together: Not on file    Attends religious service: Not on file    Active member of club or organization: Not on file    Attends meetings of clubs or  organizations: Not on file    Relationship status: Not on file  Other Topics Concern  . Not on file  Social History Narrative  . Not on file    Family History: Family History  Problem Relation Age of Onset  . Deep vein thrombosis Mother   . Cancer Father     Allergies: No Known Allergies  Medications Prior to Admission  Medication Sig Dispense Refill Last Dose  . acetaminophen (TYLENOL) 160 MG/5ML liquid Take 20 mLs (640 mg total) by mouth every 6 (six) hours as needed for fever. (Patient not taking: Reported on 01/19/2019) 120 mL 1 Not Taking  . calcium carbonate (TUMS) 500 MG chewable tablet Chew 1 tablet (200 mg of elemental calcium total) by mouth 2 (two) times daily as needed for indigestion or heartburn. (Patient not taking: Reported on 01/19/2019) 45 tablet 2 Not Taking  . Elastic Bandages & Supports (COMFORT FIT MATERNITY SUPP LG) MISC 1 each by Does not apply route daily as needed.  1 each 0 Taking  . ondansetron (ZOFRAN ODT) 4 MG disintegrating tablet Take 1 tablet (4 mg total) by mouth every 8 (eight) hours as needed for nausea or vomiting. (Patient not taking: Reported on 01/19/2019) 20 tablet 0 Not Taking  . Prenatal Vit-Fe Fumarate-FA (PRENATAL VITAMIN PO) Take by mouth.   Taking     Review of Systems  All systems reviewed and negative except as stated in HPI  Physical Exam Blood pressure 118/68, pulse (!) 54, last menstrual period 05/30/2018, unknown if currently breastfeeding. General appearance: alert, cooperative and moderate distress Lungs: clear to auscultation bilaterally Heart: regular rate and rhythm Abdomen: soft, non-tender, strong contractions palpated  Extremities: No calf swelling or tenderness Presentation: cephalic Fetal monitoring: 155 by doppler as baby crowning on EMS stretcher     Prenatal labs: ABO, Rh: --/--/O POS (11/18 1420) Antibody: NEG (11/18 1420) Rubella: 1.59 (11/18 1420) RPR: Non Reactive (11/18 1420)  HBsAg: Negative (11/18  1420)  HIV: Non Reactive (11/18 1420)  GC/Chlamydia: Negative  GBS:   Positive 2 hr Glucola: 81-101-86, normal Genetic screening:  Too late  Anatomy US: normal   Nursing Staff Provider  Office Location  [facility] Dating  LMP  Language  Eng Anatomy US  Normal   Flu Vaccine  Declined 01/19/19 Genetic Screen  NIPS:   AFP:  Too late    TDaP vaccine   01/19/19 Hgb A1C or  GTT Early  Third trimester  Glucose, Fasting 65 - 91 mg/dL 81   Glucose, 1 hour 65 - 179 mg/dL 852   Glucose, 2 hour 65 - 152 mg/dL 86     Rhogam  O positive    LAB RESULTS   Feeding Plan Breast/Bottle Blood Type --/--/O POS (11/18 1420)   Contraception Tubal  Antibody NEG (11/18 1420)  Circumcision Girl  Rubella 1.59 (11/18 1420)  Pediatrician  Unsure  RPR Non Reactive (11/18 1420)   Support Person Jaread HBsAg Negative (11/18 1420)   Prenatal Classes  HIV Non Reactive (11/18 1420)  BTL Consent  GBS  (For PCN allergy, check sensitivities)   VBAC Consent  Pap     Hgb Electro  Sickle Cell negative, normal     CF     SMA     Waterbirth  [ ]  Class [ ]  Consent [ ]  CNM visit   Prenatal Transfer Tool  Maternal Diabetes: No Genetic Screening: Too late Maternal Ultrasounds/Referrals: Normal Fetal Ultrasounds or other Referrals:  None Maternal Substance Abuse:  No Significant Maternal Medications:  None Significant Maternal Lab Results: Lab values include: Group B Strep positive  No results found for this or any previous visit (from the past 24 hour(s)).  Patient Active Problem List   Diagnosis Date Noted  . Post-dates pregnancy 03/07/2019  . Group B streptococcal infection during pregnancy 02/13/2019  . Anemia in pregnancy 02/02/2019  . Supervision of low-risk pregnancy 01/19/2019  . Late prenatal care affecting pregnancy 01/19/2019  . Round ligament pain 01/19/2019  . History of marijuana use 01/19/2019  . Social problem 10/23/2017  . Abnormal cervical Papanicolaou smear affecting pregnancy in third trimester  08/20/2017    Assessment: Heather Kim is a 33 y.o. [id number] at [redacted]w[redacted]d here for precipitous labor and delivery in MAU.   #Labor: Delivering upon arrival to MAU  #FWB: Cat I #ID:  GBS pos- not treated  #MOF: Breast and Formula  #MOC:BTL- medicaid pending   Sharyon Cable, CNM 03/07/2019, 2:19 AM

## 2019-03-08 ENCOUNTER — Encounter: Payer: Self-pay | Admitting: Medical

## 2019-03-08 ENCOUNTER — Other Ambulatory Visit: Payer: Self-pay

## 2019-03-08 LAB — CBC
HCT: 24.6 % — ABNORMAL LOW (ref 36.0–46.0)
Hemoglobin: 7.6 g/dL — ABNORMAL LOW (ref 12.0–15.0)
MCH: 25.2 pg — ABNORMAL LOW (ref 26.0–34.0)
MCHC: 30.9 g/dL (ref 30.0–36.0)
MCV: 81.5 fL (ref 80.0–100.0)
PLATELETS: 193 10*3/uL (ref 150–400)
RBC: 3.02 MIL/uL — ABNORMAL LOW (ref 3.87–5.11)
RDW: 14.4 % (ref 11.5–15.5)
WBC: 6.1 10*3/uL (ref 4.0–10.5)
nRBC: 0 % (ref 0.0–0.2)

## 2019-03-08 NOTE — Progress Notes (Signed)
Post Partum Day 1 Subjective: no complaints, up ad lib, voiding, tolerating PO and + flatus. Patient has had a bowel movement at 3 AM this morning. She is experiencing pelvic cramps, pain 6/10, when she is breast feeding. Pain ceases when baby is finished breast feeding. Her RUQ pain, which was evaluated yesterday by [place], has subsided. She has not had Percocet since yesterday around 11 AM. Denies nausea, vomiting, fever chills, RUQ pain with eating, radiation of pain. She is experiencing slight lochia, and has changed her feminine pad twice since giving birth. She has had one clot yesterday afternoon "the size of a golf ball." No other clots since then. She denies fatigue, presyncope, light-headededness, pica, or cold intolerance.   Objective: Blood pressure 114/69, pulse 67, temperature 98.2 F (36.8 C), temperature source Oral, resp. rate 18, last menstrual period 05/30/2018, SpO2 99 %, unknown if currently breastfeeding.  Physical Exam:  General: alert, cooperative, appears stated age and no distress. Lochia: appropriate Uterine Fundus: Could not be appreciated. Abdominal: epigastric tenderness to palpation. RUQ tenderness to deep palpation. Murphey's sign with pain on both inspiration and expiration.  DVT Evaluation: No evidence of DVT seen on physical exam. Negative Homan's sign. No cords or calf tenderness. No significant calf/ankle edema.  Recent Labs    03/07/19 0212 03/08/19 0720  HGB 7.7* 7.6*  HCT 24.8* 24.6*    Assessment/Plan: Contraception plans for bilateral tubal ligation.    LOS: 1 day   Heather Kim 03/08/2019, 7:56 AM

## 2019-03-09 ENCOUNTER — Other Ambulatory Visit: Payer: Self-pay

## 2019-03-09 ENCOUNTER — Encounter: Payer: Self-pay | Admitting: Obstetrics and Gynecology

## 2019-03-09 MED ORDER — FERROUS SULFATE 325 (65 FE) MG PO TABS: 325.0000 mg | ORAL_TABLET | Freq: Two times a day (BID) | ORAL | 1 refills | Status: DC

## 2019-03-09 MED ORDER — IBUPROFEN 600 MG PO TABS: 600.0000 mg | ORAL_TABLET | Freq: Four times a day (QID) | ORAL | 0 refills | Status: DC

## 2019-03-09 NOTE — Discharge Summary (Addendum)
Postpartum Discharge Summary     Patient Name: Heather Kim DOB: [birth year] MRN: [id number]  Date of admission: 03/07/2019 Delivering Provider: Sharyon Cable   Date of discharge: 03/09/2019  Admitting diagnosis: pregnancy Intrauterine pregnancy: [redacted]w[redacted]d     Secondary diagnosis:  Active Problems:   Normal labor   SVD (spontaneous vaginal delivery)  Additional problems: Depression not on meds, H/o substance use during pregnancy [facility])     Discharge diagnosis: Term Pregnancy Delivered and Anemia                                                                                                Post partum procedures:None  Augmentation: None  Complications: None  Hospital course:  Onset of Labor With Vaginal Delivery     33 y.o. yo [id number] at [redacted]w[redacted]d was admitted in Active Labor on 03/07/2019. Patient had an uncomplicated labor course as follows:  Membrane Rupture Time/Date: 1:40 AM ,03/07/2019   Intrapartum Procedures: Episiotomy: None [1]                                         Lacerations:  1st degree [2]  Patient had a delivery of a Viable infant. 03/07/2019  Information for the patient's newborn:  Rosamae, Mccarten [[id number]]  Delivery Method: Vaginal, Spontaneous(Filed from Delivery Summary)    Pateint had an uncomplicated postpartum course.  She is ambulating, tolerating a regular diet, passing flatus, and urinating well. Patient is discharged home in stable condition on 03/09/19.   Magnesium Sulfate recieved: No BMZ received: No  Physical exam  Vitals:   03/08/19 0550 03/08/19 1513 03/08/19 2230 03/09/19 0543  BP: 114/69 130/83 106/61 128/83  Pulse: 67 64 72 68  Resp: 18 18 18 20   Temp: 98.2 F (36.8 C) 98 F (36.7 C) 98.4 F (36.9 C) 98.1 F (36.7 C)  TempSrc: Oral Oral Oral Oral  SpO2:   100%    General: alert, cooperative and no distress Lochia: appropriate Uterine Fundus: firm Incision: N/A DVT Evaluation: No cords or calf tenderness. No  significant calf/ankle edema. Labs: Lab Results  Component Value Date   WBC 6.1 03/08/2019   HGB 7.6 (L) 03/08/2019   HCT 24.6 (L) 03/08/2019   MCV 81.5 03/08/2019   PLT 193 03/08/2019   CMP Latest Ref Rng & Units 10/10/2015  Glucose 65 - 99 mg/dL 82  BUN 6 - 20 mg/dL 8  Creatinine 1.69 - 4.50 mg/dL 3.88  Sodium 828 - 003 mmol/L 138  Potassium 3.5 - 5.1 mmol/L 3.3(L)  Chloride 101 - 111 mmol/L 104  CO2 22 - 32 mmol/L 25  Calcium 8.9 - 10.3 mg/dL 9.7  Total Protein 6.5 - 8.1 g/dL -  Total Bilirubin 0.3 - 1.2 mg/dL -  Alkaline Phos 38 - 491 U/L -  AST 15 - 41 U/L -  ALT 14 - 54 U/L -    Discharge instruction: per After Visit Summary and "Baby and Me Booklet".  After visit meds:  Allergies as of  03/09/2019   No Known Allergies     Medication List    STOP taking these medications   acetaminophen 160 MG/5ML liquid Commonly known as:  TYLENOL   calcium carbonate 500 MG chewable tablet Commonly known as:  Tums   ondansetron 4 MG disintegrating tablet Commonly known as:  Zofran ODT     TAKE these medications   Comfort Fit Maternity Supp Lg Misc 1 each by Does not apply route daily as needed.   ferrous sulfate 325 (65 FE) MG tablet Commonly known as:  FerrouSul Take 1 tablet (325 mg total) by mouth 2 (two) times daily.   ibuprofen 600 MG tablet Commonly known as:  ADVIL,MOTRIN Take 1 tablet (600 mg total) by mouth every 6 (six) hours.   prenatal multivitamin Tabs tablet Take 1 tablet by mouth daily at 12 noon.       Diet: low salt diet  Activity: Advance as tolerated. Pelvic rest for 6 weeks.   Outpatient follow up:4 weeks Follow up Appt: Future Appointments  Date Time Provider Department Center  04/04/2019  2:15 PM Judeth Horn, NP [facility]   Follow up Visit: Follow-up Information    Center for [facility]. Schedule an appointment as soon as possible for a visit in 2 week(s).   Specialty:  Obstetrics and Gynecology Why:  for tubal  ligation consultation  Contact information: [address] [place] [address] [phone]       Judeth Horn, NP. Go on 04/04/2019.   Specialty:  Obstetrics and Gynecology Why:  at 2:15 pm for postpartum visit Contact information: [address] [place] [address] [phone]            Please schedule this patient for Tubal Ligation Consultation visit in: 2 weeks with the following provider: Any provider For C/S patients schedule nurse incision check in weeks 2 weeks: no Low risk pregnancy complicated by: None Delivery mode:  SVD Anticipated Birth Control:  Plans Interval BTL PP Procedures needed: BTL consultation  Schedule Integrated BH visit: no. Please continue to evaluate current depression and need for medication if indicated.  Newborn Data: Live born female  Birth Weight: 6 lb 8.2 oz (2954 g) APGAR: 9, 9  Newborn Delivery   Birth date/time:  03/07/2019 01:52:00 Delivery type:  Vaginal, Spontaneous     Baby Feeding: Bottle and Breast Disposition:home with mother   03/09/2019 Joana Reamer, DO  I was present for the exam and agree with above. Pt denies dizziness. VSS. Will D/C on PO Iron BID and increase dietary iron. Offered Depo bridge until interval BTL, but pt declined. Strongly encouraged pelvic rest/abstinence until BTL.   Katrinka Blazing, [place], CNM 03/09/2019 1:21 PM

## 2019-03-09 NOTE — Lactation Note (Signed)
This note was copied from a baby's chart. Lactation Consultation Note  Patient Name: Heather Kim [id number]'N Date: 03/09/2019 Reason for consult: Follow-up assessment;Infant weight loss;Term  Baby is 71 hours old  Dyad for D/C.  Per mom breast feeding is going well and milk is coming in.  Mom denies soreness / sore nipple and engorgement prevention and tx.  Per mom has DEBP at home and declined a hand pump.  LC stressed the importance of STS feedings until the baby can stay awake for  Majority of feeding, and back to birth weight/ gaining steadily.  Discussed nutritive vs non - nutritive feeding patterns and the importance of  watching the baby for hanging out latched.  Mother informed of post-discharge support and given phone number to the lactation department, including services for phone call assistance; out-patient appointments; and breastfeeding support group. List of other breastfeeding resources in the community given in the handout. Encouraged mother to call for problems or concerns related to breastfeeding.     Maternal Data Has patient been taught Hand Expression?: Yes  Feeding    LATCH Score                   Interventions Interventions: Breast feeding basics reviewed  Lactation Tools Discussed/Used WIC Program: Yes Pump Review: Milk Storage   Consult Status Consult Status: Complete Date: 03/09/19    Kathrin Greathouse 03/09/2019, 12:07 PM

## 2019-03-09 NOTE — Progress Notes (Signed)
CSW received consult due to score 15 on Edinburgh Depression Screen, however, full assessment was completed on 3/10 and MOB was given PMAD education. MOB currently receives in-home counseling through [facility] and next appointment is scheduled for next week. Please reconsult if MOB expresses need to meet with CSW.  Archie Balboa, LCSWA  [facility] [employer] [phone]

## 2019-03-09 NOTE — Discharge Instructions (Signed)
Vaginal Delivery, Care After °Refer to this sheet in the next few weeks. These instructions provide you with information about caring for yourself after vaginal delivery. Your health care provider may also give you more specific instructions. Your treatment has been planned according to current medical practices, but problems sometimes occur. Call your health care provider if you have any problems or questions. °What can I expect after the procedure? °After vaginal delivery, it is common to have: °· Some bleeding from your vagina. °· Soreness in your abdomen, your vagina, and the area of skin between your vaginal opening and your anus (perineum). °· Pelvic cramps. °· Fatigue. °Follow these instructions at home: °Medicines °· Take over-the-counter and prescription medicines only as told by your health care provider. °· If you were prescribed an antibiotic medicine, take it as told by your health care provider. Do not stop taking the antibiotic until it is finished. °Driving ° °· Do not drive or operate heavy machinery while taking prescription pain medicine. °· Do not drive for 24 hours if you received a sedative. °Lifestyle °· Do not drink alcohol. This is especially important if you are breastfeeding or taking medicine to relieve pain. °· Do not use tobacco products, including cigarettes, chewing tobacco, or e-cigarettes. If you need help quitting, ask your health care provider. °Eating and drinking °· Drink at least 8 eight-ounce glasses of water every day unless you are told not to by your health care provider. If you choose to breastfeed your baby, you may need to drink more water than this. °· Eat high-fiber foods every day. These foods may help prevent or relieve constipation. High-fiber foods include: °? Whole grain cereals and breads. °? Brown rice. °? Beans. °? Fresh fruits and vegetables. °Activity °· Return to your normal activities as told by your health care provider. Ask your health care provider what  activities are safe for you. °· Rest as much as possible. Try to rest or take a nap when your baby is sleeping. °· Do not lift anything that is heavier than your baby or 10 lb (4.5 kg) until your health care provider says that it is safe. °· Talk with your health care provider about when you can engage in sexual activity. This may depend on your: °? Risk of infection. °? Rate of healing. °? Comfort and desire to engage in sexual activity. °Vaginal Care °· If you have an episiotomy or a vaginal tear, check the area every day for signs of infection. Check for: °? More redness, swelling, or pain. °? More fluid or blood. °? Warmth. °? Pus or a bad smell. °· Do not use tampons or douches until your health care provider says this is safe. °· Watch for any blood clots that may pass from your vagina. These may look like clumps of dark red, brown, or black discharge. °General instructions °· Keep your perineum clean and dry as told by your health care provider. °· Wear loose, comfortable clothing. °· Wipe from front to back when you use the toilet. °· Ask your health care provider if you can shower or take a bath. If you had an episiotomy or a perineal tear during labor and delivery, your health care provider may tell you not to take baths for a certain length of time. °· Wear a bra that supports your breasts and fits you well. °· If possible, have someone help you with household activities and help care for your baby for at least a few days after you   leave the hospital. °· Keep all follow-up visits for you and your baby as told by your health care provider. This is important. °Contact a health care provider if: °· You have: °? Vaginal discharge that has a bad smell. °? Difficulty urinating. °? Pain when urinating. °? A sudden increase or decrease in the frequency of your bowel movements. °? More redness, swelling, or pain around your episiotomy or vaginal tear. °? More fluid or blood coming from your episiotomy or vaginal  tear. °? Pus or a bad smell coming from your episiotomy or vaginal tear. °? A fever. °? A rash. °? Little or no interest in activities you used to enjoy. °? Questions about caring for yourself or your baby. °· Your episiotomy or vaginal tear feels warm to the touch. °· Your episiotomy or vaginal tear is separating or does not appear to be healing. °· Your breasts are painful, hard, or turn red. °· You feel unusually sad or worried. °· You feel nauseous or you vomit. °· You pass large blood clots from your vagina. If you pass a blood clot from your vagina, save it to show to your health care provider. Do not flush blood clots down the toilet without having your health care provider look at them. °· You urinate more than usual. °· You are dizzy or light-headed. °· You have not breastfed at all and you have not had a menstrual period for 12 weeks after delivery. °· You have stopped breastfeeding and you have not had a menstrual period for 12 weeks after you stopped breastfeeding. °Get help right away if: °· You have: °? Pain that does not go away or does not get better with medicine. °? Chest pain. °? Difficulty breathing. °? Blurred vision or spots in your vision. °? Thoughts about hurting yourself or your baby. °· You develop pain in your abdomen or in one of your legs. °· You develop a severe headache. °· You faint. °· You bleed from your vagina so much that you fill two sanitary pads in one hour. °This information is not intended to replace advice given to you by your health care provider. Make sure you discuss any questions you have with your health care provider. °Document Released: 12/11/2000 Document Revised: 05/27/2016 Document Reviewed: 12/29/2015 °Elsevier Interactive Patient Education © 2019 [employer] ° °

## 2019-03-13 ENCOUNTER — Inpatient Hospital Stay (HOSPITAL_COMMUNITY): Payer: Medicaid Other

## 2019-03-20 ENCOUNTER — Encounter: Payer: Self-pay | Admitting: *Deleted

## 2019-03-20 ENCOUNTER — Telehealth: Payer: Self-pay | Admitting: Obstetrics & Gynecology

## 2019-03-20 ENCOUNTER — Ambulatory Visit: Payer: Self-pay | Admitting: Obstetrics & Gynecology

## 2019-03-20 NOTE — Telephone Encounter (Signed)
Called the patient to inform of the cancellation due to the virus restrictions. The patient understood and has no questions or concerns at this time.

## 2019-04-03 ENCOUNTER — Telehealth: Payer: Self-pay | Admitting: Family Medicine

## 2019-04-03 NOTE — Telephone Encounter (Signed)
Called the patient to inform of the video visit. Left a detailed voicemail.

## 2019-04-04 ENCOUNTER — Encounter: Payer: Self-pay | Admitting: *Deleted

## 2019-04-04 ENCOUNTER — Ambulatory Visit: Payer: Medicaid Other | Admitting: Student

## 2019-04-04 ENCOUNTER — Telehealth: Payer: Self-pay

## 2019-04-04 ENCOUNTER — Other Ambulatory Visit: Payer: Self-pay

## 2019-04-04 ENCOUNTER — Ambulatory Visit: Payer: Medicaid Other | Admitting: Medical

## 2019-04-04 ENCOUNTER — Ambulatory Visit: Payer: Self-pay | Admitting: Student

## 2019-04-04 NOTE — Telephone Encounter (Signed)
Called pt twice for Virtual appointment, no answer left VM to reschedule.

## 2019-07-30 ENCOUNTER — Emergency Department (HOSPITAL_COMMUNITY): Payer: Medicaid Other

## 2019-07-30 ENCOUNTER — Encounter (HOSPITAL_COMMUNITY): Payer: Self-pay

## 2019-07-30 ENCOUNTER — Emergency Department (HOSPITAL_COMMUNITY)
Admission: EM | Admit: 2019-07-30 | Discharge: 2019-07-30 | Disposition: A | Discharge status: Home / Self Care | Payer: Medicaid Other | Attending: Emergency Medicine | Admitting: Emergency Medicine

## 2019-07-30 ENCOUNTER — Other Ambulatory Visit: Payer: Self-pay

## 2019-07-30 DIAGNOSIS — W228XXA Striking against or struck by other objects, initial encounter: Secondary | ICD-10-CM | POA: Diagnosis not present

## 2019-07-30 DIAGNOSIS — Y929 Unspecified place or not applicable: Secondary | ICD-10-CM | POA: Insufficient documentation

## 2019-07-30 DIAGNOSIS — S90921A Unspecified superficial injury of right foot, initial encounter: Secondary | ICD-10-CM | POA: Diagnosis present

## 2019-07-30 DIAGNOSIS — S99921A Unspecified injury of right foot, initial encounter: Secondary | ICD-10-CM

## 2019-07-30 DIAGNOSIS — Y939 Activity, unspecified: Secondary | ICD-10-CM | POA: Insufficient documentation

## 2019-07-30 DIAGNOSIS — Y999 Unspecified external cause status: Secondary | ICD-10-CM | POA: Diagnosis not present

## 2019-07-30 MED ORDER — ACETAMINOPHEN 500 MG PO TABS
1000.0000 mg | ORAL_TABLET | Freq: Once | ORAL | Status: AC
  Administered 2019-07-30: 1000 mg via ORAL
  Filled 2019-07-30: qty 2

## 2019-07-30 NOTE — ED Notes (Signed)
Patient transported to X-ray 

## 2019-07-30 NOTE — ED Provider Notes (Signed)
[facility]   CSN: [id number] Arrival date & time: 07/30/19  1039  History   Chief Complaint Chief Complaint  Patient presents with  . Toe Injury   HPI Heather Kim is a 33 y.o. female with past medical history who presents for evaluation of foot injury.  Patient states 5 days ago she hit the anterior distaldistal surface of her toes 5 days ago.  Initially had swelling and bruising however this dissipated. Has been able to walk however has been walking on her heels secondary to pain.  Taking ibuprofen with mild relief of her symptoms.  She rates her current pain a 7/10.  Denies radiation of pain.  Denies fever, chills, nausea, vomiting, decreased range of motion, numbness or tingling, redness, warmth to her extremities.  Denies additional aggravating or alleviating factors.  History of pain from patient and past medical records.  No interpreter was used.     HPI  Past Medical History:  Diagnosis Date  . Anxiety   . Depression   . Migraine headache     Patient Active Problem List   Diagnosis Date Noted  . Normal labor 03/07/2019  . SVD (spontaneous vaginal delivery) 03/07/2019  . Group B streptococcal infection during pregnancy 02/13/2019  . Anemia in pregnancy 02/02/2019  . Supervision of low-risk pregnancy 01/19/2019  . Late prenatal care affecting pregnancy 01/19/2019  . Round ligament pain 01/19/2019  . History of marijuana use 01/19/2019  . Social problem 10/23/2017  . Abnormal cervical Papanicolaou smear affecting pregnancy in third trimester 08/20/2017    Past Surgical History:  Procedure Laterality Date  . NO PAST SURGERIES       OB History    Gravida  6   Para  5   Term  5   Preterm      AB  1   Living  4     SAB  1   TAB      Ectopic      Multiple  0   Live Births  4            Home Medications    Prior to Admission medications   Medication Sig Start Date End Date Taking?  Authorizing Provider  Elastic Bandages & Supports (COMFORT FIT MATERNITY SUPP LG) MISC 1 each by Does not apply route daily as needed. 01/19/19   Rasch, Victorino DikeJennifer I, NP  ferrous sulfate (FERROUSUL) 325 (65 FE) MG tablet Take 1 tablet (325 mg total) by mouth 2 (two) times daily. 03/09/19   Katrinka BlazingSmith, [place], CNM  ibuprofen (ADVIL,MOTRIN) 600 MG tablet Take 1 tablet (600 mg total) by mouth every 6 (six) hours. 03/09/19   Katrinka BlazingSmith, [place], CNM  Prenatal Vit-Fe Fumarate-FA (PRENATAL MULTIVITAMIN) TABS tablet Take 1 tablet by mouth daily at 12 noon.    [provider]    Family History Family History  Problem Relation Age of Onset  . Deep vein thrombosis Mother   . Cancer Father     Social History Social History   Tobacco Use  . Smoking status: Never Smoker  . Smokeless tobacco: Never Used  Substance Use Topics  . Alcohol use: No  . Drug use: Yes    Types: Marijuana    Comment: last use 11/12/2018     Allergies   Patient has no known allergies.   Review of Systems Review of Systems  Constitutional: Negative.   HENT: Negative.   Respiratory: Negative.   Cardiovascular: Negative.  Gastrointestinal: Negative.   Genitourinary: Negative.   Musculoskeletal: Positive for gait problem.       Toe pain  Skin: Negative.   All other systems reviewed and are negative.   Physical Exam Updated Vital Signs BP 124/85 (BP Location: Left Arm)   Pulse 63   Temp 98.2 F (36.8 C) (Oral)   Resp 12   Ht 5\' 4"  (1.626 m)   Wt 90.7 kg   SpO2 100%   BMI 34.33 kg/m   Physical Exam Vitals signs and nursing note reviewed.  Constitutional:      General: She is not in acute distress.    Appearance: She is well-developed. She is not ill-appearing, toxic-appearing or diaphoretic.  HENT:     Head: Atraumatic.  Eyes:     Pupils: Pupils are equal, round, and reactive to light.  Neck:     Musculoskeletal: Normal range of motion.  Cardiovascular:     Rate and Rhythm: Normal rate.   Pulmonary:     Effort: Pulmonary effort is normal. No respiratory distress.     Breath sounds: Normal breath sounds.  Abdominal:     General: Bowel sounds are normal. There is no distension.  Musculoskeletal: Normal range of motion.        General: Tenderness present. No swelling, deformity or signs of injury.     Right lower leg: No edema.     Left lower leg: No edema.     Comments: Wiggles toes without difficulty bilaterally.  Tenderness palpation to metacarpals to third and fourth digit on right lower extremity.  Mild tenderness palpation to third and fourth digit.  No tenderness over navicular or calcaneus.  No tenderness over tibia/fibula.  No obvious deformity.  No tenderness to bilateral calves, Homans sign negative.  Negative Thompson test.  No tenderness over Achilles tendon.  Skin:    General: Skin is warm and dry.     Comments: No edema, erythema, ecchymosis warm.  No rashes or lesions.  Brisk capillary refill.  No nail bed involvement.  Neurological:     Mental Status: She is alert.     Comments: Ambulatory however with a limp to right foot.  No weakness. Intact sensation to sharp and dull.          ED Treatments / Results  Labs (all labs ordered are listed, but only abnormal results are displayed) Labs Reviewed - No data to display  EKG None  Radiology Dg Foot Complete Right  Result Date: 07/30/2019 CLINICAL DATA:  Toe injury 5 days ago. EXAM: RIGHT FOOT COMPLETE - 3+ VIEW COMPARISON:  None. FINDINGS: There is no evidence of fracture or dislocation. There is no evidence of arthropathy or other focal bone abnormality. Soft tissues are unremarkable. IMPRESSION: Negative. Electronically Signed   By: Abelardo Diesel M.D.   On: 07/30/2019 11:47    Procedures Procedures (including critical care time)  Medications Ordered in ED Medications  acetaminophen (TYLENOL) tablet 1,000 mg (has no administration in time range)    Initial Impression / Assessment and Plan / ED  Course  I have reviewed the triage vital signs and the nursing notes.  Pertinent labs & imaging results that were available during my care of the patient were reviewed by me and considered in my medical decision making (see chart for details).  33 year old female appears otherwise well presents for evaluation of foot injury which occurred 5 days PTA.  She is afebrile, nonseptic, non-ill-appearing.  Patient stubbed her third and fourth digits on  her right lower extremity on her son's scooter.  Has had tenderness to her third and fourth digits as well as her distal metatarsals.  No tenderness to calcaneous, navicular.  No evidence of LisFranc injury. No tenderness to tibia and fibula.  Has been ambulatory however with limp.  She is no overlying skin changes to indicate infectious process.  Neurovascularly intact.  Normal Musculoskeletal exam.  Wiggles toes without difficulty.  Nontender bilateral calves.  Compartments soft. Plain film obtained from triage which does not show acute fracture, dislocation, effusion.  Likely MSK injury.  Will provide RICE for symptomatic management outpatient follow-up with orthopedics.  Provided crutches.  No contusions, abrasions or lacerations to suture. No evidence of septic joint, gout, hemarthrosis, fracture, dislocation, DVT, compartment syndrome.  The patient has been appropriately medically screened and/or stabilized in the ED. I have low suspicion for any other emergent medical condition which would require further screening, evaluation or treatment in the ED or require inpatient management.  Patient is hemodynamically stable and in no acute distress.  Patient able to ambulate in department prior to ED.  Evaluation does not show acute pathology that would require ongoing or additional emergent interventions while in the emergency department or further inpatient treatment.  I have discussed the diagnosis with the patient and answered all questions.  Pain is been managed  while in the emergency department and patient has no further complaints prior to discharge.  Patient is comfortable with plan discussed in room and is stable for discharge at this time.  I have discussed strict return precautions for returning to the emergency department.  Patient was encouraged to follow-up with PCP/specialist refer to at discharge.        Final Clinical Impressions(s) / ED Diagnoses   Final diagnoses:  Injury of toe on right foot, initial encounter    ED Discharge Orders    None       Henderly, Britni A, PA-C 07/30/19 1153    Sabas SousBero, Michael M, MD 08/03/19 [phone]

## 2019-07-30 NOTE — Discharge Instructions (Signed)
Sure to ice, elevate the extremity.  Can alternate Tylenol and ibuprofen as needed for pain.  Do not exceed more than 4000 mg of Tylenol a day or 2400 mg of ibuprofen.  Crutches as we have provided.  Follow-up with orthopedics if you continue to have pain beyond 1 week.

## 2019-07-30 NOTE — Progress Notes (Signed)
Orthopedic Tech Progress Note Patient Details:  Heather Kim 06/18/1986 [id number]  Ortho Devices Type of Ortho Device: Crutches Ortho Device/Splint Interventions: Adjustment   Post Interventions Patient Tolerated: Ambulated well Instructions Provided: Poper ambulation with device   Janit Pagan 07/30/2019, 12:37 PM

## 2019-07-30 NOTE — ED Triage Notes (Signed)
Patient complains of toe injury to right foot 5 days ago. Pain worse with ambulation

## 2019-11-19 IMAGING — US US MFM OB FOLLOW-UP
1 series · 13 of 28 positions shown · non-contrast
Comparison: none

[Series 1: us mfm ob follow-up · 62 acquisitions, 13 frames shown]
[im 3/62]
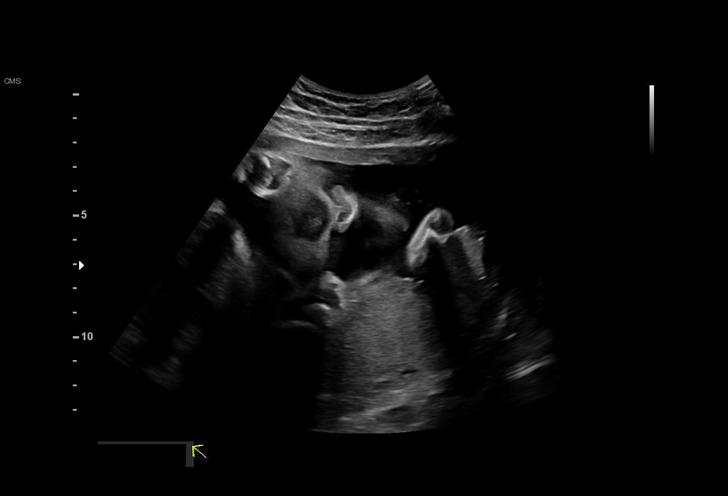
[im 7/62]
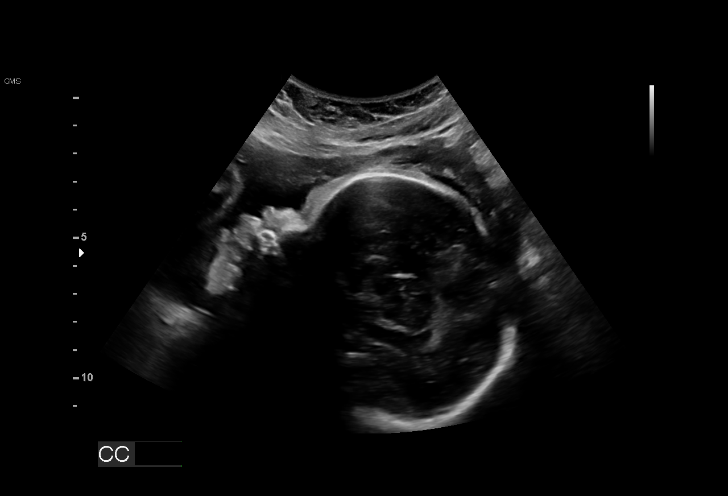
[im 12/62]
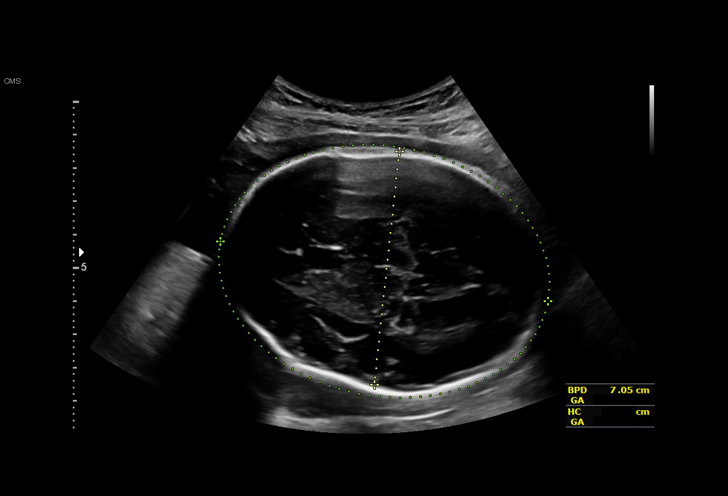
[im 16/62]
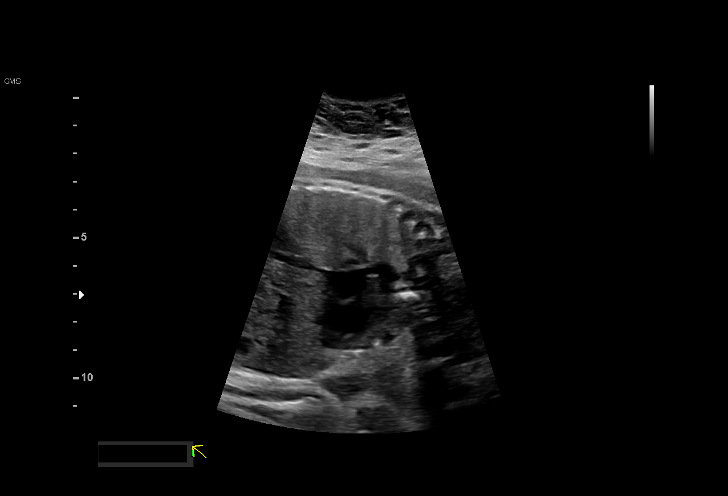
[im 21/62]
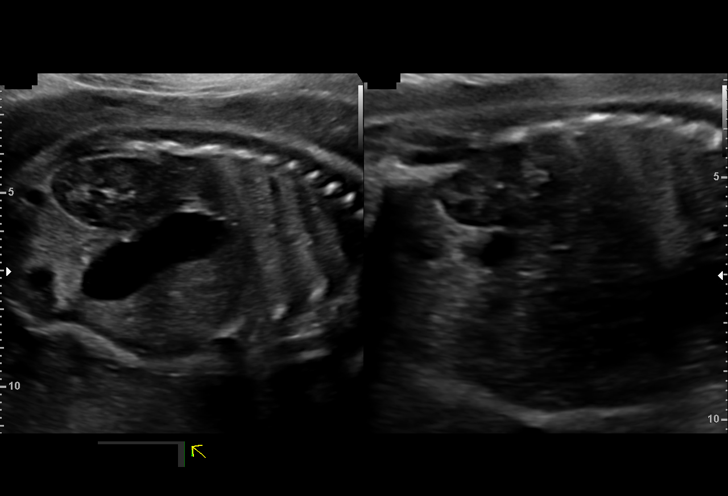
[im 25/62]
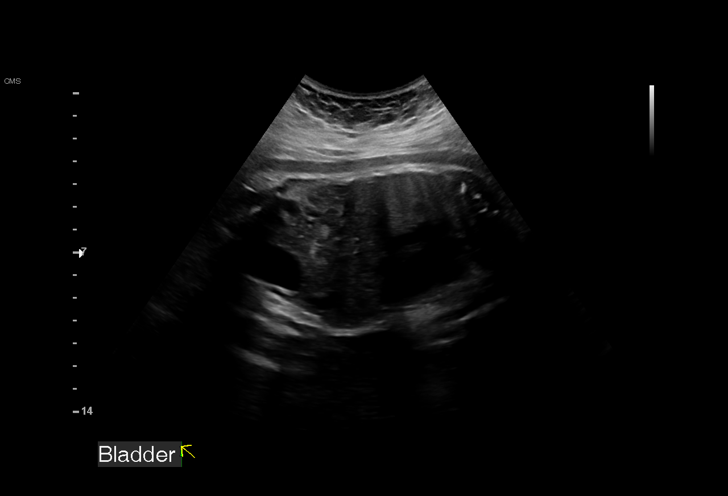
[im 32/62]
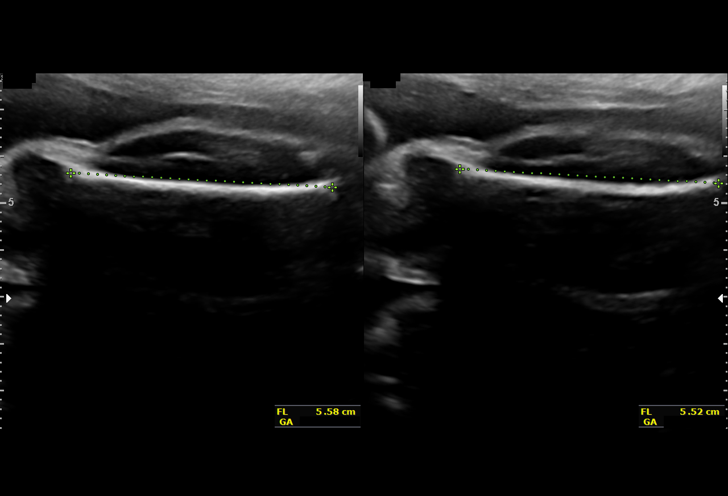
[im 37/62]
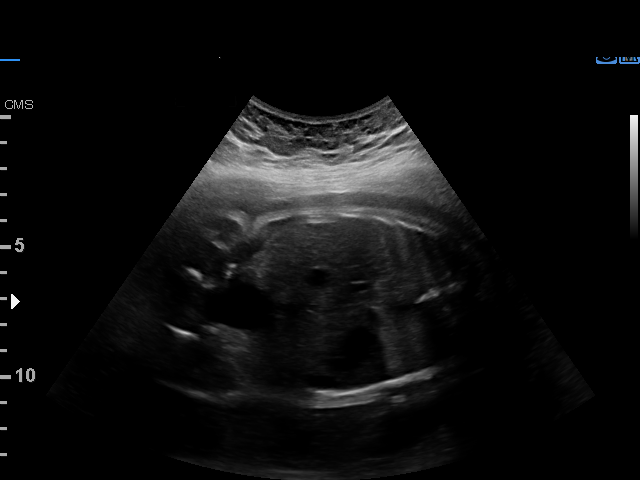
[im 41/62]
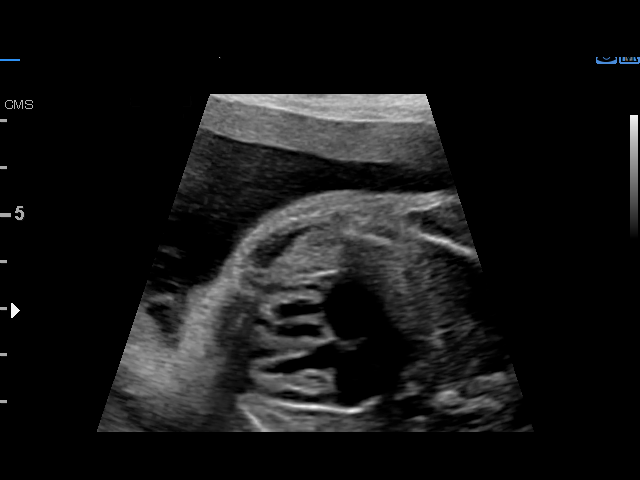
[im 46/62]
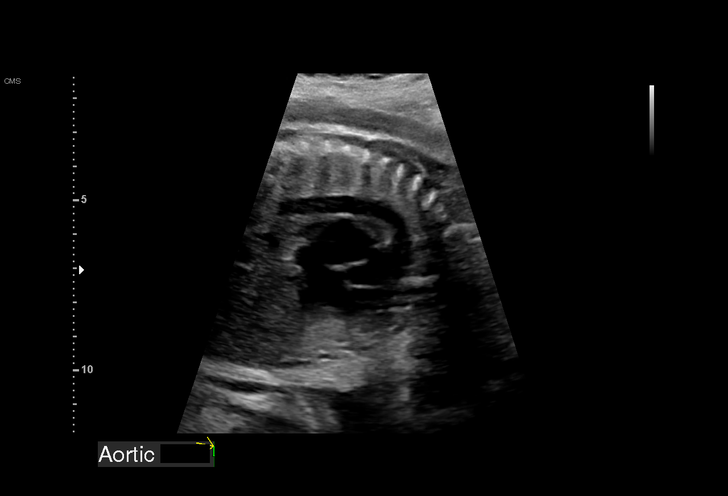
[im 50/62]
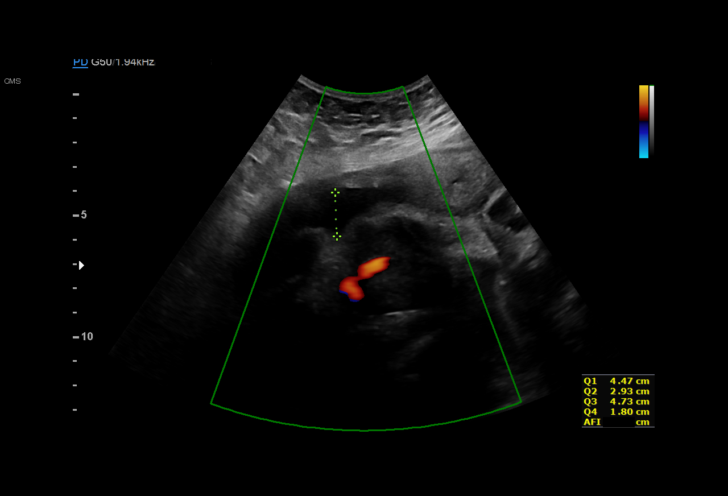
[im 55/62]
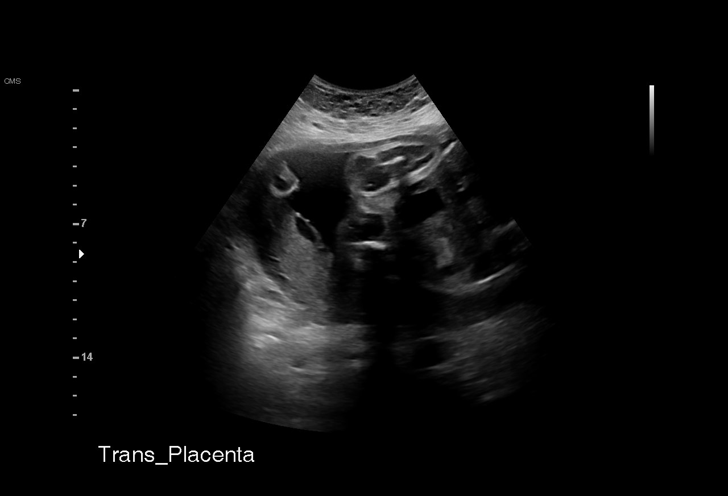
[im 59/62]
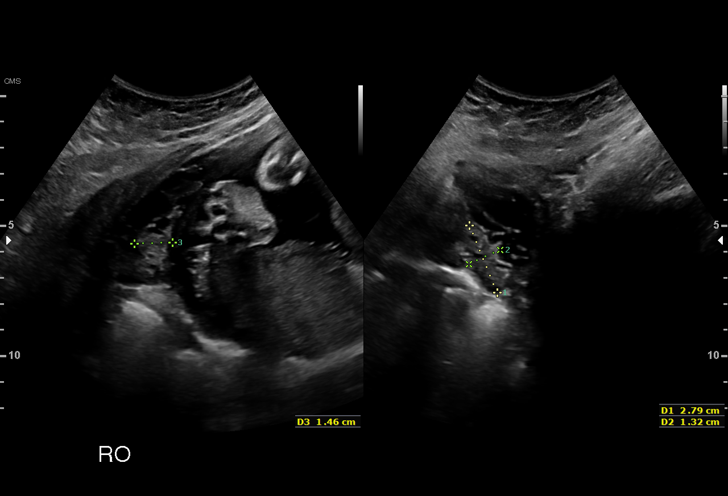

[13 of 28 positions shown; findings below may reference images not displayed]

DIPALI NP

 ----------------------------------------------------------------------

 ----------------------------------------------------------------------
Indications

  Antenatal follow-up for nonvisualized fetal
  anatomy
  Insufficient Prenatal Care
  Obesity complicating pregnancy, third
  trimester
  Marijuana Abuse
  30 weeks gestation of pregnancy
 ----------------------------------------------------------------------
Fetal Evaluation

 Num Of Fetuses:          1
 Fetal Heart Rate(bpm):   126
 Cardiac Activity:        Observed
 Presentation:            Cephalic
 Placenta:                Posterior
 P. Cord Insertion:       Not well visualized

 Amniotic Fluid
 AFI FV:      Within normal limits

 AFI Sum(cm)     %Tile       Largest Pocket(cm)
 13.93           46

 RUQ(cm)       RLQ(cm)       LUQ(cm)        LLQ(cm)

Biometry

 BPD:      71.1  mm     G. Age:  28w 4d          3  %    CI:        67.83   %    70 - 86
                                                         FL/HC:       20.5  %    19.2 -
 HC:      276.2  mm     G. Age:  30w 2d         12  %    HC/AC:       1.09       0.99 -
 AC:      253.9  mm     G. Age:  29w 4d         22  %    FL/BPD:      79.5  %    71 - 87
 FL:       56.5  mm     G. Age:  29w 5d         18  %    FL/AC:       22.3  %    20 - 24
 HUM:      53.9  mm     G. Age:  31w 2d         70  %

 Est. FW:    3371   gm     3 lb 2 oz     36  %
OB History

 Gravidity:    6         Term:   4         SAB:   1
 Living:       4
Gestational Age

 LMP:           30w 3d        Date:  05/30/18                 EDD:   03/06/19
 U/S Today:     29w 4d                                        EDD:   03/12/19
 Best:          30w 3d     Det. By:  LMP  (05/30/18)          EDD:   03/06/19
Anatomy

 Cranium:               Appears normal         LVOT:                   Appears normal
 Cavum:                 Appears normal         Aortic Arch:            Appears normal
 Ventricles:            Appears normal         Ductal Arch:            Appears normal
 Choroid Plexus:        Appears normal         Diaphragm:              Appears normal
 Cerebellum:            Appears normal         Stomach:                Appears normal, left
                                                                       sided
 Posterior Fossa:       Previously seen        Abdomen:                Appears normal
 Nuchal Fold:           Not applicable (>20    Abdominal Wall:         Previously seen
                        wks GA)
 Face:                  Orbits nl; profile     Cord Vessels:           Previously seen
                        prev visualized
 Lips:                  Appears normal         Kidneys:                Appear normal
 Palate:                Previously seen        Bladder:                Appears normal
 Thoracic:              Appears normal         Spine:                  Previously seen
 Heart:                 Appears normal         Upper Extremities:      Previously seen
                        (4CH, axis, and
                        situs)
 RVOT:                  Appears normal         Lower Extremities:      Previously seen

 Other:  Technically difficult due to maternal habitus and fetal position.  Nasal
         bone visualized.
Cervix Uterus Adnexa

 Cervix
 Not visualized (advanced GA >32wks)

 Uterus
 No abnormality visualized.

 Left Ovary
 Within normal limits.

 Right Ovary
 Within normal limits.

 Cul De Sac
 No free fluid seen.

 Adnexa
 No abnormality visualized.
Impression

 Normal interval growth.
Recommendations

 Follow up as clinically indicated.

## 2020-12-28 NOTE — L&D Delivery Note (Addendum)
OB/GYN Faculty Practice Delivery Note  Heather Kim is a 35 y.o. [id number] s/p SVD at [redacted]w[redacted]d. She was admitted for SOL.   ROM: 0h 52m with Clear fluid GBS Status:  Unknown, Amp given  Maximum Maternal Temperature: 98.1  Labor Progress: Patient presented to L&D for SOL. Initial SVE: 6/90/-1. She then progressed to complete and AROM performed shortly before delivery.   Delivery Date/Time: 10/21/2021 @ 0256  Delivery: Called to room and patient was complete and pushing. Head delivered LOA. No nuchal cord present. Shoulder and body delivered in usual fashion. Infant with spontaneous cry, placed on mother's abdomen, dried and stimulated. Cord clamped x 2 after 1-minute delay, and cut by mother of baby. Cord blood drawn. Placenta delivered spontaneously with gentle cord traction. Fundus firm with massage and Pitocin. Labia, perineum, vagina, and cervix inspected with bilateral periurethral lacerations that were repaired with 4-0 Monocryl.  Placenta: Intact; 3VC - sent to L&D Complications: None Lacerations: Bilateral periurethral EBL: 69 mL Analgesia: Epidural  Infant: APGARs 8/9  Weight 3189 g  [name], DO 10/21/2021, 3:23 AM PGY-1,  Family Medicine  GME ATTESTATION:  I saw and evaluated the patient. I was gloved and present for the entire delivery and management of the patient. I agree with the findings and the plan of care as documented in the resident's note. I have made changes to documentation as necessary.  Evalina Field, MD OB Fellow, Faculty [facility], [facility] for [facility] 10/21/2021 7:45 AM

## 2021-07-06 ENCOUNTER — Inpatient Hospital Stay (HOSPITAL_COMMUNITY)
Admission: AD | Admit: 2021-07-06 | Discharge: 2021-07-06 | Disposition: A | Discharge status: Home / Self Care | Payer: Medicaid Other | Attending: Family Medicine | Admitting: Family Medicine

## 2021-07-06 ENCOUNTER — Inpatient Hospital Stay (HOSPITAL_BASED_OUTPATIENT_CLINIC_OR_DEPARTMENT_OTHER): Payer: Medicaid Other

## 2021-07-06 ENCOUNTER — Encounter (HOSPITAL_COMMUNITY): Payer: Self-pay

## 2021-07-06 DIAGNOSIS — O99322 Drug use complicating pregnancy, second trimester: Secondary | ICD-10-CM | POA: Insufficient documentation

## 2021-07-06 DIAGNOSIS — R109 Unspecified abdominal pain: Secondary | ICD-10-CM

## 2021-07-06 DIAGNOSIS — Z3A23 23 weeks gestation of pregnancy: Secondary | ICD-10-CM

## 2021-07-06 DIAGNOSIS — O98312 Other infections with a predominantly sexual mode of transmission complicating pregnancy, second trimester: Secondary | ICD-10-CM | POA: Diagnosis not present

## 2021-07-06 DIAGNOSIS — Z3686 Encounter for antenatal screening for cervical length: Secondary | ICD-10-CM | POA: Diagnosis not present

## 2021-07-06 DIAGNOSIS — R102 Pelvic and perineal pain: Secondary | ICD-10-CM

## 2021-07-06 DIAGNOSIS — A599 Trichomoniasis, unspecified: Secondary | ICD-10-CM | POA: Insufficient documentation

## 2021-07-06 DIAGNOSIS — O26892 Other specified pregnancy related conditions, second trimester: Secondary | ICD-10-CM | POA: Diagnosis not present

## 2021-07-06 DIAGNOSIS — O26899 Other specified pregnancy related conditions, unspecified trimester: Secondary | ICD-10-CM

## 2021-07-06 DIAGNOSIS — O09522 Supervision of elderly multigravida, second trimester: Secondary | ICD-10-CM | POA: Diagnosis not present

## 2021-07-06 DIAGNOSIS — F1290 Cannabis use, unspecified, uncomplicated: Secondary | ICD-10-CM | POA: Insufficient documentation

## 2021-07-06 LAB — URINALYSIS, ROUTINE W REFLEX MICROSCOPIC
Bilirubin Urine: NEGATIVE
Glucose, UA: NEGATIVE mg/dL
Hgb urine dipstick: NEGATIVE
Ketones, ur: 80 mg/dL — AB
Nitrite: NEGATIVE
Protein, ur: NEGATIVE mg/dL
Specific Gravity, Urine: 1.018 (ref 1.005–1.030)
pH: 6 (ref 5.0–8.0)

## 2021-07-06 LAB — HEMOGLOBIN A1C
Hgb A1c MFr Bld: 5.5 % (ref 4.8–5.6)
Mean Plasma Glucose: 111.15 mg/dL

## 2021-07-06 LAB — DIFFERENTIAL
Abs Immature Granulocytes: 0.01 10*3/uL (ref 0.00–0.07)
Basophils Absolute: 0 10*3/uL (ref 0.0–0.1)
Basophils Relative: 0 %
Eosinophils Absolute: 0.4 10*3/uL (ref 0.0–0.5)
Eosinophils Relative: 5 %
Immature Granulocytes: 0 %
Lymphocytes Relative: 17 %
Lymphs Abs: 1.3 10*3/uL (ref 0.7–4.0)
Monocytes Absolute: 0.5 10*3/uL (ref 0.1–1.0)
Monocytes Relative: 7 %
Neutro Abs: 5.4 10*3/uL (ref 1.7–7.7)
Neutrophils Relative %: 71 %

## 2021-07-06 LAB — COMPREHENSIVE METABOLIC PANEL
ALT: 8 U/L (ref 0–44)
AST: 14 U/L — ABNORMAL LOW (ref 15–41)
Albumin: 2.9 g/dL — ABNORMAL LOW (ref 3.5–5.0)
Alkaline Phosphatase: 54 U/L (ref 38–126)
Anion gap: 11 (ref 5–15)
BUN: <5 mg/dL — ABNORMAL LOW (ref 6–20)
CO2: 21 mmol/L — ABNORMAL LOW (ref 22–32)
Calcium: 8.8 mg/dL — ABNORMAL LOW (ref 8.9–10.3)
Chloride: 102 mmol/L (ref 98–111)
Creatinine, Ser: 0.55 mg/dL (ref 0.44–1.00)
GFR, Estimated: >60 mL/min (ref >60)
Glucose, Bld: 70 mg/dL (ref 70–99)
Potassium: 3.4 mmol/L — ABNORMAL LOW (ref 3.5–5.1)
Sodium: 134 mmol/L — ABNORMAL LOW (ref 135–145)
Total Bilirubin: 0.5 mg/dL (ref 0.3–1.2)
Total Protein: 6.7 g/dL (ref 6.5–8.1)

## 2021-07-06 LAB — CBC
HCT: 29.6 % — ABNORMAL LOW (ref 36.0–46.0)
Hemoglobin: 9.4 g/dL — ABNORMAL LOW (ref 12.0–15.0)
MCH: 25.8 pg — ABNORMAL LOW (ref 26.0–34.0)
MCHC: 31.8 g/dL (ref 30.0–36.0)
MCV: 81.3 fL (ref 80.0–100.0)
Platelets: 206 10*3/uL (ref 150–400)
RBC: 3.64 MIL/uL — ABNORMAL LOW (ref 3.87–5.11)
RDW: 15.2 % (ref 11.5–15.5)
WBC: 7.6 10*3/uL (ref 4.0–10.5)
nRBC: 0 % (ref 0.0–0.2)

## 2021-07-06 LAB — TYPE AND SCREEN
ABO/RH(D): O POS
Antibody Screen: NEGATIVE

## 2021-07-06 LAB — WET PREP, GENITAL
Clue Cells Wet Prep HPF POC: NONE SEEN
Sperm: NONE SEEN
Yeast Wet Prep HPF POC: NONE SEEN

## 2021-07-06 LAB — HIV ANTIBODY (ROUTINE TESTING W REFLEX): HIV Screen 4th Generation wRfx: NONREACTIVE

## 2021-07-06 LAB — POCT PREGNANCY, URINE: Preg Test, Ur: POSITIVE — AB

## 2021-07-06 LAB — FETAL FIBRONECTIN: Fetal Fibronectin: NEGATIVE

## 2021-07-06 MED ORDER — LACTATED RINGERS IV BOLUS
1000.0000 mL | Freq: Once | INTRAVENOUS | Status: AC
  Administered 2021-07-06: 1000 mL via INTRAVENOUS

## 2021-07-06 MED ORDER — METRONIDAZOLE 500 MG PO TABS
2000.0000 mg | ORAL_TABLET | Freq: Once | ORAL | Status: AC
  Administered 2021-07-06: 2000 mg via ORAL
  Filled 2021-07-06: qty 4

## 2021-07-06 NOTE — Discharge Instructions (Signed)

## 2021-07-06 NOTE — MAU Provider Note (Signed)
History     CSN: [id number]  Arrival date and time: 07/06/21 1116   Event Date/Time   First Provider Initiated Contact with Patient 07/06/21 1157      Chief Complaint  Patient presents with   Abdominal Pain   HPI Heather Kim is a 35 y.o. [id number] at [redacted]w[redacted]d who presents with abdominal pain. She reports it is a discomfort that makes her feel like she is going to throw up but can't. She assumed it was a stomach bug but was concerned because she is pregnant. She states she found out she was pregnant in March. She denies any leaking or bleeding. She reports normal fetal movement. She has not been seen anywhere this pregnancy.  OB History     Gravida  7   Para  5   Term  5   Preterm      AB  1   Living  5      SAB  1   IAB      Ectopic      Multiple  0   Live Births  5           Past Medical History:  Diagnosis Date   Anxiety    Depression    Migraine headache     Past Surgical History:  Procedure Laterality Date   NO PAST SURGERIES      Family History  Problem Relation Age of Onset   Deep vein thrombosis Mother    Cancer Father     Social History   Tobacco Use   Smoking status: Never   Smokeless tobacco: Never  Vaping Use   Vaping Use: Never used  Substance Use Topics   Alcohol use: No   Drug use: Yes    Types: Marijuana    Comment: last used marijuana 06/27/21    Allergies: No Known Allergies  Medications Prior to Admission  Medication Sig Dispense Refill Last Dose   Elastic Bandages & Supports (COMFORT FIT MATERNITY SUPP LG) MISC 1 each by Does not apply route daily as needed. 1 each 0    ferrous sulfate (FERROUSUL) 325 (65 FE) MG tablet Take 1 tablet (325 mg total) by mouth 2 (two) times daily. 60 tablet 1    ibuprofen (ADVIL,MOTRIN) 600 MG tablet Take 1 tablet (600 mg total) by mouth every 6 (six) hours. 30 tablet 0    Prenatal Vit-Fe Fumarate-FA (PRENATAL MULTIVITAMIN) TABS tablet Take 1 tablet by mouth daily at 12 noon.        Review of Systems  Constitutional: Negative.  Negative for fatigue and fever.  HENT: Negative.    Respiratory: Negative.  Negative for shortness of breath.   Cardiovascular: Negative.  Negative for chest pain.  Gastrointestinal:  Positive for abdominal pain. Negative for constipation, diarrhea, nausea and vomiting.  Genitourinary: Negative.  Negative for dysuria, vaginal bleeding and vaginal discharge.  Neurological: Negative.  Negative for dizziness and headaches.  Physical Exam   Blood pressure 106/60, pulse 75, temperature 97.7 F (36.5 C), temperature source Oral, resp. rate 17, height 5\' 4"  (1.626 m), last menstrual period 01/25/2021, SpO2 100 %, unknown if currently breastfeeding.  Physical Exam Vitals and nursing note reviewed.  Constitutional:      General: She is not in acute distress.    Appearance: She is well-developed.  HENT:     Head: Normocephalic.  Eyes:     Pupils: Pupils are equal, round, and reactive to light.  Cardiovascular:     Rate and  Rhythm: Normal rate and regular rhythm.     Heart sounds: Normal heart sounds.  Pulmonary:     Effort: Pulmonary effort is normal. No respiratory distress.     Breath sounds: Normal breath sounds.  Abdominal:     General: Bowel sounds are normal. There is no distension.     Palpations: Abdomen is soft.     Tenderness: There is no abdominal tenderness.  Skin:    General: Skin is warm and dry.  Neurological:     Mental Status: She is alert and oriented to person, place, and time.  Psychiatric:        Mood and Affect: Mood normal.        Behavior: Behavior normal.        Thought Content: Thought content normal.        Judgment: Judgment normal.   Fetal Tracing:  Baseline:  150 Variability: moderate  Accels: none Decels: none  Toco: none  Dilation: Closed Effacement (%): 70 Station: -1 Exam by:: Cleone Slim, CNM   MAU Course  Procedures Results for orders placed or performed during the hospital  encounter of 07/06/21 (from the past 24 hour(s))  Pregnancy, urine POC     Status: Abnormal   Collection Time: 07/06/21 11:34 AM  Result Value Ref Range   Preg Test, Ur POSITIVE (A) NEGATIVE  Urinalysis, Routine w reflex microscopic Urine, Clean Catch     Status: Abnormal   Collection Time: 07/06/21 12:00 PM  Result Value Ref Range   Color, Urine YELLOW YELLOW   APPearance HAZY (A) CLEAR   Specific Gravity, Urine 1.018 1.005 - 1.030   pH 6.0 5.0 - 8.0   Glucose, UA NEGATIVE NEGATIVE mg/dL   Hgb urine dipstick NEGATIVE NEGATIVE   Bilirubin Urine NEGATIVE NEGATIVE   Ketones, ur 80 (A) NEGATIVE mg/dL   Protein, ur NEGATIVE NEGATIVE mg/dL   Nitrite NEGATIVE NEGATIVE   Leukocytes,Ua MODERATE (A) NEGATIVE   RBC / HPF 0-5 0 - 5 RBC/hpf   WBC, UA 0-5 0 - 5 WBC/hpf   Bacteria, UA RARE (A) NONE SEEN   Squamous Epithelial / LPF 11-20 0 - 5   Mucus PRESENT    Trichomonas, UA PRESENT (A) NONE SEEN  Fetal fibronectin     Status: None   Collection Time: 07/06/21 12:07 PM  Result Value Ref Range   Fetal Fibronectin NEGATIVE NEGATIVE  Wet prep, genital     Status: Abnormal   Collection Time: 07/06/21 12:07 PM   Specimen: PATH Cytology Cervicovaginal Ancillary Only  Result Value Ref Range   Yeast Wet Prep HPF POC NONE SEEN NONE SEEN   Trich, Wet Prep PRESENT (A) NONE SEEN   Clue Cells Wet Prep HPF POC NONE SEEN NONE SEEN   WBC, Wet Prep HPF POC FEW (A) NONE SEEN   Sperm NONE SEEN   CBC     Status: Abnormal   Collection Time: 07/06/21 12:29 PM  Result Value Ref Range   WBC 7.6 4.0 - 10.5 K/uL   RBC 3.64 (L) 3.87 - 5.11 MIL/uL   Hemoglobin 9.4 (L) 12.0 - 15.0 g/dL   HCT 16.1 (L) 09.6 - 04.5 %   MCV 81.3 80.0 - 100.0 fL   MCH 25.8 (L) 26.0 - 34.0 pg   MCHC 31.8 30.0 - 36.0 g/dL   RDW 40.9 81.1 - 91.4 %   Platelets 206 150 - 400 K/uL   nRBC 0.0 0.0 - 0.2 %  Differential     Status: None  Collection Time: 07/06/21 12:29 PM  Result Value Ref Range   Neutrophils Relative % 71 %    Neutro Abs 5.4 1.7 - 7.7 K/uL   Lymphocytes Relative 17 %   Lymphs Abs 1.3 0.7 - 4.0 K/uL   Monocytes Relative 7 %   Monocytes Absolute 0.5 0.1 - 1.0 K/uL   Eosinophils Relative 5 %   Eosinophils Absolute 0.4 0.0 - 0.5 K/uL   Basophils Relative 0 %   Basophils Absolute 0.0 0.0 - 0.1 K/uL   Immature Granulocytes 0 %   Abs Immature Granulocytes 0.01 0.00 - 0.07 K/uL  HIV Antibody (routine testing w rflx)     Status: None   Collection Time: 07/06/21 12:29 PM  Result Value Ref Range   HIV Screen 4th Generation wRfx Non Reactive Non Reactive  Type and screen [facility]     Status: None   Collection Time: 07/06/21 12:29 PM  Result Value Ref Range   ABO/RH(D) O POS    Antibody Screen NEG    Sample Expiration      07/09/2021,2359 Performed at [facility], [address] [place], [place] [address]   Hemoglobin A1c     Status: None   Collection Time: 07/06/21 12:29 PM  Result Value Ref Range   Hgb A1c MFr Bld 5.5 4.8 - 5.6 %   Mean Plasma Glucose 111.15 mg/dL  Comprehensive metabolic panel     Status: Abnormal   Collection Time: 07/06/21 12:29 PM  Result Value Ref Range   Sodium 134 (L) 135 - 145 mmol/L   Potassium 3.4 (L) 3.5 - 5.1 mmol/L   Chloride 102 98 - 111 mmol/L   CO2 21 (L) 22 - 32 mmol/L   Glucose, Bld 70 70 - 99 mg/dL   BUN <5 (L) 6 - 20 mg/dL   Creatinine, Ser 6.04 0.44 - 1.00 mg/dL   Calcium 8.8 (L) 8.9 - 10.3 mg/dL   Total Protein 6.7 6.5 - 8.1 g/dL   Albumin 2.9 (L) 3.5 - 5.0 g/dL   AST 14 (L) 15 - 41 U/L   ALT 8 0 - 44 U/L   Alkaline Phosphatase 54 38 - 126 U/L   Total Bilirubin 0.5 0.3 - 1.2 mg/dL   GFR, Estimated >54 >09 mL/min   Anion gap 11 5 - 15    [place] MFM OB Limited  Result Date: 07/06/2021 ----------------------------------------------------------------------  OBSTETRICS REPORT                         (Signed Final 07/06/2021 03:00 pm) ---------------------------------------------------------------------- Patient Info  ID #:         [id number]                          D.O.B.:  04-12-1986 (35 yrs)  Name:        [name]                Visit Date: 07/06/2021 12:15 pm ---------------------------------------------------------------------- Performed By  Attending:         Noralee Space MD        Ref. Address:      Faculty  Performed By:      Birdena Crandall        [facility]  Referred By:       Rolm Bookbinder                     CNM ---------------------------------------------------------------------- Orders  #   Description                          Code         Ordered By  1   [place] MFM OB LIMITED                    [phone]     Yancy Knoble ----------------------------------------------------------------------  #   Order #                    Accession #                 Episode #  1   [id number]                  [id number]                  [id number] ---------------------------------------------------------------------- Indications  Pelvic pain affecting pregnancy in second       O26.892  trimester  Advanced maternal age multigravida 42+,         O106.522  second trimester  Encounter for cervical length                   Z36.86  [redacted] weeks gestation of pregnancy                 Z3A.23 ---------------------------------------------------------------------- Fetal Evaluation  Num Of Fetuses:          1  Fetal Heart Rate(bpm):   143  Cardiac Activity:        Observed  Presentation:            Cephalic  Placenta:                Anterior  P. Cord Insertion:       Visualized, central  Amniotic Fluid  AFI FV:      Within normal limits                              Largest Pocket(cm)                              6.7  Comment:     No placental abruption or previa identified. ---------------------------------------------------------------------- OB History  Gravidity:     7         Term:  5          SAB:   1  Living:        5  ---------------------------------------------------------------------- Gestational Age  LMP:            23w 1d       Date:  01/25/21                   EDD:  11/01/21  Best:           23w 1d    Det. By:  LMP  (01/25/21)  EDD:  11/01/21 ---------------------------------------------------------------------- Anatomy  Cranium:                Appears normal         Face:                   Profile appears                                                                         normal  Cavum:                  Appears normal         Abdominal Wall:         Appears nml (cord                                                                         insert, abd wall)  Ventricles:             Appears normal         Cord Vessels:           Appears normal (3                                                                         vessel cord)  Choroid Plexus:         Appears normal         Kidneys:                Appear normal  Cerebellum:             Appears normal         Bladder:                Appears normal  Posterior Fossa:        Appears normal  Other:   Fetus appears to be a female. Technically difficult due to maternal habitus. ---------------------------------------------------------------------- Cervix Uterus Adnexa  Cervix  Length:             3.2  cm.  Normal appearance by transabdominal scan.  Uterus  No abnormality visualized. ---------------------------------------------------------------------- Impression  Patient is being evaluated at the MAU for c/o abdominal  cramping.  A limited ultrasound study was performed .Amniotic fluid is  normal and good fetal activity is seen. On transabdominal scan,  the cervix looks long and closed . ----------------------------------------------------------------------                  Noralee Space, MD Electronically Signed Final Report   07/06/2021 03:00 pm ----------------------------------------------------------------------    MDM UA Wet prep and gc/chlamydia FFN LR  bolus  Cervix felt subjectively short on digital exam. Will obtain ultrasound [place]  MFM OB Limited  Metronidazole PO  Due to advanced gestation and no PNC, will get OB Panel today and [facility].  Assessment and Plan   1. Abdominal pain affecting pregnancy   2. [redacted] weeks gestation of pregnancy   3. Trichomoniasis    -Discharge home in stable condition -Second trimester precautions discussed -[facility] -Patient may return to MAU as needed or if her condition were to change or worsen   Rolm BookbinderCaroline M [place] Brogden 07/06/2021, 11:57 AM

## 2021-07-06 NOTE — MAU Note (Signed)
Pt reports to mau with c/o lower abd cramping since yesterday.  Pt denies vag bleeding or LOF.  Pt states she has not started care and has not been seen for this pregnancy yet.

## 2021-07-06 NOTE — MAU Note (Signed)
Patient left without ultrasound pictures. RN attempted all three numbers on file with no luck.   U/S photos placed in file on MAU.

## 2021-07-07 LAB — GC/CHLAMYDIA PROBE AMP (~~LOC~~) NOT AT ARMC
Chlamydia: NEGATIVE
Comment: NEGATIVE
Comment: NORMAL
Neisseria Gonorrhea: NEGATIVE

## 2021-07-07 LAB — RPR: RPR Ser Ql: NONREACTIVE

## 2021-07-07 LAB — RUBELLA SCREEN: Rubella: 1.28 index (ref >0.99)

## 2021-10-20 ENCOUNTER — Inpatient Hospital Stay (EMERGENCY_DEPARTMENT_HOSPITAL)
Admission: AD | Admit: 2021-10-20 | Discharge: 2021-10-20 | Disposition: A | Discharge status: Home / Self Care | Payer: Medicaid Other | Source: Home / Self Care | Attending: Family Medicine | Admitting: Family Medicine

## 2021-10-20 ENCOUNTER — Other Ambulatory Visit: Payer: Self-pay

## 2021-10-20 ENCOUNTER — Encounter (HOSPITAL_COMMUNITY): Payer: Self-pay | Admitting: Obstetrics & Gynecology

## 2021-10-20 DIAGNOSIS — Z3A38 38 weeks gestation of pregnancy: Secondary | ICD-10-CM

## 2021-10-20 DIAGNOSIS — O471 False labor at or after 37 completed weeks of gestation: Secondary | ICD-10-CM

## 2021-10-20 NOTE — MAU Provider Note (Signed)
Event Date/Time   First Provider Initiated Contact with Patient 10/20/21 1521       S: Ms. Heather Kim is a 35 y.o. [id number] at [redacted]w[redacted]d  who presents to MAU today complaining contractions q 6-7 minutes since this morning. She denies vaginal bleeding. She  reports  LOF. She reports normal fetal movement.    O: BP 112/66   Pulse 83   Temp 97.9 F (36.6 C)   Resp 18   Ht 5\' 4"  (1.626 m)   Wt 94.3 kg   LMP 01/25/2021 (Exact Date)   BMI 35.70 kg/m  GENERAL: Well-developed, well-nourished female in no acute distress.  HEAD: Normocephalic, atraumatic.  CHEST: Normal effort of breathing, regular heart rate ABDOMEN: Soft, nontender, gravid  Cervical exam:  Dilation: 2.5 Effacement (%): 60, 70 Cervical Position: Posterior Station: Ballotable Presentation: Undeterminable Exam by:: n druebbisch rn   Fetal Monitoring: Baseline: 130 Variability: moderate Accelerations: ++ Decelerations: none Contractions: irregular  Fern Neg  A: SIUP at [redacted]w[redacted]d  False labor  P: Discharge to home.  [redacted]w[redacted]d, DO 10/20/2021 3:52 PM

## 2021-10-20 NOTE — MAU Note (Signed)
Rots shas had ctx off and on for 3 days. They got stronger and closer this morning. About 6-7 min apart now. Denies and vag bleeding but has had some wetness/leaking for the past 2 hours. Thinks her mucus plug is coming out. Good fetal movement reported.  No prenatal care.

## 2021-10-21 ENCOUNTER — Inpatient Hospital Stay (HOSPITAL_COMMUNITY): Payer: Medicaid Other | Admitting: Anesthesiology

## 2021-10-21 ENCOUNTER — Encounter (HOSPITAL_COMMUNITY): Payer: Self-pay | Admitting: Obstetrics and Gynecology

## 2021-10-21 ENCOUNTER — Other Ambulatory Visit: Payer: Self-pay

## 2021-10-21 ENCOUNTER — Inpatient Hospital Stay (HOSPITAL_COMMUNITY)
Admission: AD | Admit: 2021-10-21 | Discharge: 2021-10-22 | DRG: 806 | Disposition: A | Discharge status: Home / Self Care | Payer: Medicaid Other | Attending: Obstetrics and Gynecology | Admitting: Obstetrics and Gynecology

## 2021-10-21 DIAGNOSIS — D509 Iron deficiency anemia, unspecified: Secondary | ICD-10-CM | POA: Diagnosis present

## 2021-10-21 DIAGNOSIS — O26893 Other specified pregnancy related conditions, third trimester: Secondary | ICD-10-CM | POA: Diagnosis present

## 2021-10-21 DIAGNOSIS — F1290 Cannabis use, unspecified, uncomplicated: Secondary | ICD-10-CM | POA: Diagnosis present

## 2021-10-21 DIAGNOSIS — O99214 Obesity complicating childbirth: Secondary | ICD-10-CM | POA: Diagnosis present

## 2021-10-21 DIAGNOSIS — O9902 Anemia complicating childbirth: Secondary | ICD-10-CM | POA: Diagnosis present

## 2021-10-21 DIAGNOSIS — O99324 Drug use complicating childbirth: Secondary | ICD-10-CM | POA: Diagnosis present

## 2021-10-21 DIAGNOSIS — Z3A38 38 weeks gestation of pregnancy: Secondary | ICD-10-CM | POA: Diagnosis not present

## 2021-10-21 DIAGNOSIS — O0930 Supervision of pregnancy with insufficient antenatal care, unspecified trimester: Secondary | ICD-10-CM

## 2021-10-21 DIAGNOSIS — O99019 Anemia complicating pregnancy, unspecified trimester: Secondary | ICD-10-CM | POA: Diagnosis present

## 2021-10-21 DIAGNOSIS — Z20822 Contact with and (suspected) exposure to covid-19: Secondary | ICD-10-CM | POA: Diagnosis present

## 2021-10-21 LAB — CBC
HCT: 29.2 % — ABNORMAL LOW (ref 36.0–46.0)
HCT: 26.3 % — ABNORMAL LOW (ref 36.0–46.0)
Hemoglobin: 9 g/dL — ABNORMAL LOW (ref 12.0–15.0)
Hemoglobin: 8.1 g/dL — ABNORMAL LOW (ref 12.0–15.0)
MCH: 23.3 pg — ABNORMAL LOW (ref 26.0–34.0)
MCH: 23.1 pg — ABNORMAL LOW (ref 26.0–34.0)
MCHC: 30.8 g/dL (ref 30.0–36.0)
MCHC: 30.8 g/dL (ref 30.0–36.0)
MCV: 75.6 fL — ABNORMAL LOW (ref 80.0–100.0)
MCV: 74.9 fL — ABNORMAL LOW (ref 80.0–100.0)
Platelets: 216 10*3/uL (ref 150–400)
Platelets: 193 10*3/uL (ref 150–400)
RBC: 3.86 MIL/uL — ABNORMAL LOW (ref 3.87–5.11)
RBC: 3.51 MIL/uL — ABNORMAL LOW (ref 3.87–5.11)
RDW: 14.6 % (ref 11.5–15.5)
RDW: 14.6 % (ref 11.5–15.5)
WBC: 8.3 10*3/uL (ref 4.0–10.5)
WBC: 12.7 10*3/uL — ABNORMAL HIGH (ref 4.0–10.5)
nRBC: 0 % (ref 0.0–0.2)
nRBC: 0 % (ref 0.0–0.2)

## 2021-10-21 LAB — RESP PANEL BY RT-PCR (FLU A&B, COVID) ARPGX2
Influenza A by PCR: NEGATIVE
Influenza B by PCR: NEGATIVE
SARS Coronavirus 2 by RT PCR: NEGATIVE

## 2021-10-21 LAB — TYPE AND SCREEN
ABO/RH(D): O POS
Antibody Screen: NEGATIVE

## 2021-10-21 LAB — RPR: RPR Ser Ql: NONREACTIVE

## 2021-10-21 LAB — GC/CHLAMYDIA PROBE AMP (~~LOC~~) NOT AT ARMC
Chlamydia: NEGATIVE
Comment: NEGATIVE
Comment: NORMAL
Neisseria Gonorrhea: NEGATIVE

## 2021-10-21 LAB — HEPATITIS B SURFACE ANTIGEN: Hepatitis B Surface Ag: NONREACTIVE

## 2021-10-21 MED ORDER — FENTANYL-BUPIVACAINE-NACL 0.5-0.125-0.9 MG/250ML-% EP SOLN
EPIDURAL | Status: AC
  Filled 2021-10-21: qty 250

## 2021-10-21 MED ORDER — ACETAMINOPHEN 325 MG PO TABS
650.0000 mg | ORAL_TABLET | ORAL | Status: DC | PRN
  Administered 2021-10-21 (×2): 650 mg via ORAL
  Filled 2021-10-21 (×2): qty 2

## 2021-10-21 MED ORDER — LIDOCAINE HCL (PF) 1 % IJ SOLN: 30.0000 mL | INTRAMUSCULAR | Status: DC | PRN

## 2021-10-21 MED ORDER — OXYCODONE-ACETAMINOPHEN 5-325 MG PO TABS: 2.0000 | ORAL_TABLET | ORAL | Status: DC | PRN

## 2021-10-21 MED ORDER — ONDANSETRON HCL 4 MG/2ML IJ SOLN: 4.0000 mg | Freq: Four times a day (QID) | INTRAMUSCULAR | Status: DC | PRN

## 2021-10-21 MED ORDER — OXYCODONE HCL 5 MG PO TABS
5.0000 mg | ORAL_TABLET | Freq: Once | ORAL | Status: AC
Start: 2021-10-22 — End: 2021-10-21
  Administered 2021-10-21: 5 mg via ORAL
  Filled 2021-10-21: qty 1

## 2021-10-21 MED ORDER — OXYCODONE-ACETAMINOPHEN 5-325 MG PO TABS: 1.0000 | ORAL_TABLET | ORAL | Status: DC | PRN

## 2021-10-21 MED ORDER — TETANUS-DIPHTH-ACELL PERTUSSIS 5-2.5-18.5 LF-MCG/0.5 IM SUSY: 0.5000 mL | PREFILLED_SYRINGE | Freq: Once | INTRAMUSCULAR | Status: DC

## 2021-10-21 MED ORDER — DIBUCAINE (PERIANAL) 1 % EX OINT: 1.0000 "application " | TOPICAL_OINTMENT | CUTANEOUS | Status: DC | PRN

## 2021-10-21 MED ORDER — EPHEDRINE 5 MG/ML INJ: 10.0000 mg | INTRAVENOUS | Status: DC | PRN

## 2021-10-21 MED ORDER — WITCH HAZEL-GLYCERIN EX PADS: 1.0000 "application " | MEDICATED_PAD | CUTANEOUS | Status: DC | PRN

## 2021-10-21 MED ORDER — LACTATED RINGERS IV SOLN: 500.0000 mL | Freq: Once | INTRAVENOUS | Status: DC

## 2021-10-21 MED ORDER — PHENYLEPHRINE 40 MCG/ML (10ML) SYRINGE FOR IV PUSH (FOR BLOOD PRESSURE SUPPORT): 80.0000 ug | PREFILLED_SYRINGE | INTRAVENOUS | Status: DC | PRN

## 2021-10-21 MED ORDER — SODIUM CHLORIDE 0.9 % IV SOLN
2.0000 g | Freq: Once | INTRAVENOUS | Status: AC
  Administered 2021-10-21: 2 g via INTRAVENOUS

## 2021-10-21 MED ORDER — COCONUT OIL OIL: 1.0000 "application " | TOPICAL_OIL | Status: DC | PRN

## 2021-10-21 MED ORDER — BENZOCAINE-MENTHOL 20-0.5 % EX AERO
1.0000 "application " | INHALATION_SPRAY | CUTANEOUS | Status: DC | PRN
  Administered 2021-10-21: 1 via TOPICAL
  Filled 2021-10-21: qty 56

## 2021-10-21 MED ORDER — SODIUM CHLORIDE 0.9 % IV SOLN
1.0000 g | INTRAVENOUS | Status: DC
  Filled 2021-10-21 (×3): qty 1000

## 2021-10-21 MED ORDER — FENTANYL-BUPIVACAINE-NACL 0.5-0.125-0.9 MG/250ML-% EP SOLN: 12.0000 mL/h | EPIDURAL | Status: DC | PRN

## 2021-10-21 MED ORDER — OXYTOCIN-SODIUM CHLORIDE 30-0.9 UT/500ML-% IV SOLN
2.5000 [IU]/h | INTRAVENOUS | Status: DC
  Filled 2021-10-21: qty 500

## 2021-10-21 MED ORDER — SOD CITRATE-CITRIC ACID 500-334 MG/5ML PO SOLN: 30.0000 mL | ORAL | Status: DC | PRN

## 2021-10-21 MED ORDER — OXYTOCIN BOLUS FROM INFUSION
333.0000 mL | Freq: Once | INTRAVENOUS | Status: AC
  Administered 2021-10-21: 333 mL via INTRAVENOUS

## 2021-10-21 MED ORDER — SODIUM CHLORIDE 0.9 % IV SOLN
INTRAVENOUS | Status: AC
  Filled 2021-10-21: qty 2000

## 2021-10-21 MED ORDER — SIMETHICONE 80 MG PO CHEW: 80.0000 mg | CHEWABLE_TABLET | ORAL | Status: DC | PRN

## 2021-10-21 MED ORDER — LACTATED RINGERS IV SOLN: 500.0000 mL | INTRAVENOUS | Status: DC | PRN

## 2021-10-21 MED ORDER — LIDOCAINE HCL (PF) 1 % IJ SOLN
INTRAMUSCULAR | Status: DC | PRN
  Administered 2021-10-21: 10 mL via EPIDURAL

## 2021-10-21 MED ORDER — DIPHENHYDRAMINE HCL 50 MG/ML IJ SOLN: 12.5000 mg | INTRAMUSCULAR | Status: DC | PRN

## 2021-10-21 MED ORDER — FENTANYL CITRATE (PF) 100 MCG/2ML IJ SOLN
50.0000 ug | INTRAMUSCULAR | Status: DC | PRN
  Administered 2021-10-21: 100 ug via INTRAVENOUS
  Filled 2021-10-21: qty 2

## 2021-10-21 MED ORDER — FENTANYL-BUPIVACAINE-NACL 0.5-0.125-0.9 MG/250ML-% EP SOLN
12.0000 mL/h | EPIDURAL | Status: DC | PRN
  Administered 2021-10-21: 12 mL/h via EPIDURAL

## 2021-10-21 MED ORDER — ONDANSETRON HCL 4 MG PO TABS: 4.0000 mg | ORAL_TABLET | ORAL | Status: DC | PRN

## 2021-10-21 MED ORDER — PRENATAL MULTIVITAMIN CH
1.0000 | ORAL_TABLET | Freq: Every day | ORAL | Status: DC
  Administered 2021-10-21 – 2021-10-22 (×2): 1 via ORAL
  Filled 2021-10-21 (×2): qty 1

## 2021-10-21 MED ORDER — DIPHENHYDRAMINE HCL 25 MG PO CAPS: 25.0000 mg | ORAL_CAPSULE | Freq: Four times a day (QID) | ORAL | Status: DC | PRN

## 2021-10-21 MED ORDER — SENNOSIDES-DOCUSATE SODIUM 8.6-50 MG PO TABS
2.0000 | ORAL_TABLET | Freq: Every day | ORAL | Status: DC
  Administered 2021-10-22: 2 via ORAL
  Filled 2021-10-21: qty 2

## 2021-10-21 MED ORDER — ONDANSETRON HCL 4 MG/2ML IJ SOLN: 4.0000 mg | INTRAMUSCULAR | Status: DC | PRN

## 2021-10-21 MED ORDER — ACETAMINOPHEN 325 MG PO TABS: 650.0000 mg | ORAL_TABLET | ORAL | Status: DC | PRN

## 2021-10-21 MED ORDER — LACTATED RINGERS IV SOLN: INTRAVENOUS | Status: DC

## 2021-10-21 MED ORDER — FERROUS SULFATE 325 (65 FE) MG PO TABS
325.0000 mg | ORAL_TABLET | Freq: Every day | ORAL | Status: DC
  Administered 2021-10-21 – 2021-10-22 (×2): 325 mg via ORAL
  Filled 2021-10-21 (×2): qty 1

## 2021-10-21 MED ORDER — IBUPROFEN 600 MG PO TABS
600.0000 mg | ORAL_TABLET | Freq: Four times a day (QID) | ORAL | Status: DC
  Administered 2021-10-21 – 2021-10-22 (×6): 600 mg via ORAL
  Filled 2021-10-21 (×6): qty 1

## 2021-10-21 NOTE — H&P (Addendum)
OBSTETRIC ADMISSION HISTORY AND PHYSICAL  Heather Kim is a 35 y.o. female [phone] with IUP at [redacted]w[redacted]d by LMP presenting for SOL with contractions beginning when waking up in the morning. She reports +FMs, no LOF, no VB, headaches, no blurry vision, no RUQ pain.  She plans on breast feeding. Birth control not discussed at the time of admission due to patient physically uncomfortable. She received no prenatal care with this pregnancy.  Dating: By LMP --->  Estimated Date of Delivery: 11/01/21  Sono: @[redacted]w[redacted]d , normal anatomy, cephalic presentation, anterior placenta, no EFW  Prenatal History/Complications:  None per patient, no prenatal care, marijuana use during pregnancy  Past Medical History: Past Medical History:  Diagnosis Date   Anxiety    Depression    Migraine headache     Past Surgical History: Past Surgical History:  Procedure Laterality Date   NO PAST SURGERIES      Obstetrical History: OB History     Gravida  7   Para  5   Term  5   Preterm      AB  1   Living  5      SAB  1   IAB      Ectopic      Multiple  0   Live Births  5           Social History Social History   Socioeconomic History   Marital status: Single    Spouse name: Not on file   Number of children: Not on file   Years of education: Not on file   Highest education level: Not on file  Occupational History   Not on file  Tobacco Use   Smoking status: Never   Smokeless tobacco: Never  Vaping Use   Vaping Use: Never used  Substance and Sexual Activity   Alcohol use: No   Drug use: Yes    Types: Marijuana    Comment: last used marijuana 2 weeks ago (10/20/21)   Sexual activity: Yes  Other Topics Concern   Not on file  Social History Narrative   Not on file   Social Determinants of Health   Financial Resource Strain: Not on file  Food Insecurity: Not on file  Transportation Needs: Not on file  Physical Activity: Not on file  Stress: Not on file  Social  Connections: Not on file    Family History: Family History  Problem Relation Age of Onset   Deep vein thrombosis Mother    Cancer Father     Allergies: No Known Allergies  Medications Prior to Admission  Medication Sig Dispense Refill Last Dose   Elastic Bandages & Supports (COMFORT FIT MATERNITY SUPP LG) MISC 1 each by Does not apply route daily as needed. 1 each 0    ferrous sulfate (FERROUSUL) 325 (65 FE) MG tablet Take 1 tablet (325 mg total) by mouth 2 (two) times daily. 60 tablet 1    Prenatal Vit-Fe Fumarate-FA (PRENATAL MULTIVITAMIN) TABS tablet Take 1 tablet by mouth daily at 12 noon.       Review of Systems  All systems reviewed and negative except as stated in HPI  Temperature 98.1 F (36.7 C), temperature source Oral, last menstrual period 01/25/2021, unknown if currently breastfeeding.  General appearance: alert and cooperative Lungs: normal work of breathing Heart: regular rate and rhythm Abdomen: soft, non-tender; bowel sounds normal Extremities: Homans sign is negative, no sign of DVT Presentation: cephalic Fetal monitoring: Baseline: 125 bpm, Variability: Good {>  6 bpm), Accelerations: none, and Decelerations: Absent Uterine activity: Frequency: Every 2-3 minutes Dilation: 8 Effacement (%): 90 Station: -1 Presentation: Vertex Exam by:: Santiago Bur, RN   Prenatal labs: ABO, Rh: --/--/O POS (07/10 1229) Antibody: NEG (07/10 1229) Rubella: 1.28 (07/10 1229) RPR: NON REACTIVE (07/10 1229)  HBsAg:    HIV: Non Reactive (07/10 1229)  GBS:  unknown GTT: none, HgbA1c 5.5 Genetic screening: none Anatomy US: none  Prenatal Transfer Tool  Maternal Diabetes: No Genetic Screening: Declined Maternal Ultrasounds/Referrals: Other:limited Fetal Ultrasounds or other Referrals:  None Maternal Substance Abuse:  Yes:  Type: Marijuana Significant Maternal Medications:  None Significant Maternal Lab Results: None  No results found for this or any previous  visit (from the past 24 hour(s)).  Patient Active Problem List   Diagnosis Date Noted   Indication for care in labor and delivery, antepartum 10/21/2021   Normal labor 03/07/2019   SVD (spontaneous vaginal delivery) 03/07/2019   Group B streptococcal infection during pregnancy 02/13/2019   Anemia in pregnancy 02/02/2019   Supervision of low-risk pregnancy 01/19/2019   Late prenatal care affecting pregnancy 01/19/2019   Round ligament pain 01/19/2019   History of marijuana use 01/19/2019   Social problem 10/23/2017   Abnormal cervical Papanicolaou smear affecting pregnancy in third trimester 08/20/2017    Assessment/Plan:  Heather Kim is a 35 y.o. [id number] at [redacted]w[redacted]d here for SOL.  #Labor: SOL. Limited prenatal care. OB panel and labs placed. Will continue expectant management. Anticipate SVD shortly.  #Pain: Plan for epidural; awaiting CBC #Fetal Well Being: Category I #ID: GBS unknown; hx of GBS with prior pregnancy. Ampicillin ordered.  #Method Of Feeding: breast feeding #Method Of Contraception:  not discussed   #Circ: unsure  #Limited prenatal care: Plan for SW consult postpartum.   [name], DO 10/21/2021, 12:51 AM PGY-1, [facility]  GME ATTESTATION:  I saw and evaluated the patient. I agree with the findings and the plan of care as documented in the resident's note. I have made changes to documentation as necessary.  Heather Field, MD OB Fellow, Faculty [facility], [facility] 10/21/2021 7:30 AM

## 2021-10-21 NOTE — Anesthesia Postprocedure Evaluation (Signed)
Anesthesia Post Note  Patient: Heather Kim  Procedure(s) Performed: AN AD HOC LABOR EPIDURAL     Patient location during evaluation: Mother Baby Anesthesia Type: Epidural Level of consciousness: awake and alert Pain management: pain level controlled Vital Signs Assessment: post-procedure vital signs reviewed and stable Respiratory status: spontaneous breathing, nonlabored ventilation and respiratory function stable Cardiovascular status: stable Postop Assessment: no headache, no backache and epidural receding Anesthetic complications: no   No notable events documented.  Last Vitals:  Vitals:   10/21/21 1015 10/21/21 1400  BP: 100/64 108/72  Pulse: 60 60  Resp: 18 16  Temp: 36.7 C 36.6 C  SpO2: 100% 100%    Last Pain:  Vitals:   10/21/21 1450  TempSrc:   PainSc: 8    Pain Goal:                   [employer]

## 2021-10-21 NOTE — MAU Note (Addendum)
Pt reports to MAU via EMS c/o ctx 1 minute apart. Denies any LOF. +FM  Monitors applied with reassuring fetal heart tones. Baseline of 135  SVE: 8/90/-1 with bloody show and vertex presentation  Ctx pain score: 10/10  Vital signs within normal limits.

## 2021-10-21 NOTE — Lactation Note (Signed)
This note was copied from a baby's chart. Lactation Consultation Note  Patient Name: Heather Kim [id number] Date: 10/21/2021   Age:35 hours  LC Note:  Spoke with RN prior to visiting in L&D; mother declined lactation services after delivery.  She does not desire lactation services on the M/B unit.   Maternal Data    Feeding    LATCH Score                    Lactation Tools Discussed/Used    Interventions    Discharge    Consult Status      [name] R [name] 10/21/2021, 3:36 AM

## 2021-10-21 NOTE — Anesthesia Preprocedure Evaluation (Signed)
Anesthesia Evaluation  Patient identified by MRN, date of birth, ID band Patient awake    Reviewed: Allergy & Precautions, H&P , NPO status , Patient's Chart, lab work & pertinent test results, reviewed documented beta blocker date and time   Airway Mallampati: I  TM Distance: >3 FB Neck ROM: full    Dental no notable dental hx. (+) Teeth Intact, Dental Advisory Given   Pulmonary neg pulmonary ROS,    Pulmonary exam normal breath sounds clear to auscultation       Cardiovascular negative cardio ROS Normal cardiovascular exam Rhythm:regular Rate:Normal     Neuro/Psych negative neurological ROS  negative psych ROS   GI/Hepatic negative GI ROS, Neg liver ROS,   Endo/Other  Morbid obesity  Renal/GU negative Renal ROS  negative genitourinary   Musculoskeletal   Abdominal (+) + obese,   Peds  Hematology  (+) Blood dyscrasia, anemia ,   Anesthesia Other Findings   Reproductive/Obstetrics (+) Pregnancy                             Anesthesia Physical Anesthesia Plan  ASA: 2  Anesthesia Plan: Epidural   Post-op Pain Management:    Induction:   PONV Risk Score and Plan:   Airway Management Planned: Natural Airway  Additional Equipment: None  Intra-op Plan:   Post-operative Plan:   Informed Consent: I have reviewed the patients History and Physical, chart, labs and discussed the procedure including the risks, benefits and alternatives for the proposed anesthesia with the patient or authorized representative who has indicated his/her understanding and acceptance.       Plan Discussed with: Anesthesiologist  Anesthesia Plan Comments:         Anesthesia Quick Evaluation

## 2021-10-21 NOTE — Discharge Summary (Signed)
Postpartum Discharge Summary    Patient Name: Heather Kim DOB: Jun 27, 1986 MRN: [id number]  Date of admission: 10/21/2021 Delivery date:10/21/2021  Delivering provider: Wells Guiles  Date of discharge: 10/22/2021  Admitting diagnosis: Indication for care in labor and delivery, antepartum [O75.9] Intrauterine pregnancy: [redacted]w[redacted]d    Secondary diagnosis:  Principal Problem:   Vaginal delivery Active Problems:   Anemia in pregnancy   Indication for care in labor and delivery, antepartum   Limited prenatal care  Additional problems: None    Discharge diagnosis: Term Pregnancy Delivered                                              Post partum procedures: None Augmentation: AROM Complications: None  Hospital course: Onset of Labor With Vaginal Delivery      35y.o. yo [id number] admitted in Active Labor on 10/21/2021. Patient had an uncomplicated labor course as follows:  Membrane Rupture Time/Date: 2:27 AM ,10/21/2021   Delivery Method:Vaginal, Spontaneous  Episiotomy: None  Lacerations:  Periurethral;Vaginal  Patient had an uncomplicated postpartum course.  She is ambulating, tolerating a regular diet, passing flatus, and urinating well. Patient is discharged home in stable condition on 10/22/21.  Newborn Data: Birth date:10/21/2021  Birth time:2:56 AM  Gender:Female  Living status:Living  Apgars:8 ,9  Weight:3189 g   Magnesium Sulfate received: No BMZ received: No Rhophylac: N/A MMR: N/A - Immune T-DaP: Offered postpartum Flu: No Transfusion: No  Physical exam  Vitals:   10/21/21 1400 10/21/21 1757 10/21/21 2011 10/22/21 0516  BP: 108/72 119/83 118/73 103/69  Pulse: 60 74 78 62  Resp: '16 18 18 18  ' Temp: 97.8 F (36.6 C) 98 F (36.7 C) 98.4 F (36.9 C) 98 F (36.7 C)  TempSrc: Oral Oral Oral Oral  SpO2: 100% 100% 100% 100%   General: alert, cooperative, and no distress Lochia: appropriate Uterine Fundus: firm Incision: N/A DVT Evaluation:  no LE edema or calf tenderness to palpation  Labs: Lab Results  Component Value Date   WBC 12.7 (H) 10/21/2021   HGB 8.1 (L) 10/21/2021   HCT 26.3 (L) 10/21/2021   MCV 74.9 (L) 10/21/2021   PLT 193 10/21/2021   CMP Latest Ref Rng & Units 07/06/2021  Glucose 70 - 99 mg/dL 70  BUN 6 - 20 mg/dL <5(L)  Creatinine 0.44 - 1.00 mg/dL 0.55  Sodium 135 - 145 mmol/L 134(L)  Potassium 3.5 - 5.1 mmol/L 3.4(L)  Chloride 98 - 111 mmol/L 102  CO2 22 - 32 mmol/L 21(L)  Calcium 8.9 - 10.3 mg/dL 8.8(L)  Total Protein 6.5 - 8.1 g/dL 6.7  Total Bilirubin 0.3 - 1.2 mg/dL 0.5  Alkaline Phos 38 - 126 U/L 54  AST 15 - 41 U/L 14(L)  ALT 0 - 44 U/L 8   Edinburgh Score: Edinburgh Postnatal Depression Scale Screening Tool 10/21/2021  I have been able to laugh and see the funny side of things. 0  I have looked forward with enjoyment to things. 0  I have blamed myself unnecessarily when things went wrong. 0  I have been anxious or worried for no good reason. 0  I have felt scared or panicky for no good reason. 0  Things have been getting on top of me. 1  I have been so unhappy that I have had difficulty sleeping. 0  I  have felt sad or miserable. 0  I have been so unhappy that I have been crying. 0  The thought of harming myself has occurred to me. 0  Edinburgh Postnatal Depression Scale Total 1     After visit meds:  Allergies as of 10/22/2021   No Known Allergies      Medication List     TAKE these medications    acetaminophen 500 MG tablet Commonly known as: TYLENOL Take 2 tablets (1,000 mg total) by mouth every 8 (eight) hours as needed (pain).   Comfort Fit Maternity Supp Lg Misc 1 each by Does not apply route daily as needed.   ferrous sulfate 325 (65 FE) MG tablet Commonly known as: FerrouSul Take 1 tablet (325 mg total) by mouth every other day. What changed: when to take this   ibuprofen 600 MG tablet Commonly known as: ADVIL Take 1 tablet (600 mg total) by mouth every 6  (six) hours as needed (pain).   prenatal multivitamin Tabs tablet Take 1 tablet by mouth daily at 12 noon.         Discharge home in stable condition Infant Feeding: Bottle and Breast Infant Disposition:home with mother Discharge instruction: per After Visit Summary and Postpartum booklet. Activity: Advance as tolerated. Pelvic rest for 6 weeks.  Diet: routine diet Future Appointments: Future Appointments  Date Time Provider [place]  11/25/2021 10:55 AM Rasch, Artist Pais, NP [facility]   Follow up Visit: No prenatal care. Message sent to [facility] by Dr. Gwenlyn Perking on 10/21/21.   Please schedule this patient for a In person postpartum visit in 4 weeks with the following provider: Any provider. Additional Postpartum F/U: None   Low risk pregnancy complicated by:  No prenatal care Delivery mode:  Vaginal, Spontaneous  Anticipated Birth Control:  Unsure; considering interval BTL. Message sent to [facility] to schedule surgical consult next available.   10/22/2021 Genia Del, MD

## 2021-10-21 NOTE — Progress Notes (Addendum)
LABOR PROGRESS NOTE  [name] is a 35 y.o. O1V6153 at [redacted]w[redacted]d  presented for SOL.  Subjective: Feeling increased pressure. Pain is more tolerable with epidural.  Objective: BP 106/66   Pulse 67   Temp 97.7 F (36.5 C) (Oral)   Resp 18   LMP 01/25/2021 (Exact Date)   SpO2 99%  Vitals:   10/21/21 0155 10/21/21 0158 10/21/21 0200 10/21/21 0205  BP: 105/72  115/78 106/66  Pulse: 86  84 67  Resp:      Temp:      TempSrc:      SpO2: 99% 99% 100% 99%    Dilation: 10 Dilation Complete Date: 10/21/21 Dilation Complete Time: 0214 Effacement (%): 100 Station: 0 Presentation: Vertex Exam by:: Dr Royal Piedra Fetal monitoring: Baseline: 130 bpm, Variability: Good {> 6 bpm), Accelerations: Reactive, and Decelerations: Absent Uterine activity: Frequency: Every 2 minutes  Labs: Lab Results  Component Value Date   WBC 8.3 10/21/2021   HGB 9.0 (L) 10/21/2021   HCT 29.2 (L) 10/21/2021   MCV 75.6 (L) 10/21/2021   PLT 216 10/21/2021    Patient Active Problem List   Diagnosis Date Noted   Indication for care in labor and delivery, antepartum 10/21/2021   Normal labor 03/07/2019   SVD (spontaneous vaginal delivery) 03/07/2019   Group B streptococcal infection during pregnancy 02/13/2019   Anemia in pregnancy 02/02/2019   Supervision of low-risk pregnancy 01/19/2019   Late prenatal care affecting pregnancy 01/19/2019   Round ligament pain 01/19/2019   History of marijuana use 01/19/2019   Social problem 10/23/2017   Abnormal cervical Papanicolaou smear affecting pregnancy in third trimester 08/20/2017    Assessment / Plan: 35 y.o. [id number] at [redacted]w[redacted]d here for SOL.  Labor: Progressing well. AROM this check without complication. Anticipate delivery soon. Fetal Wellbeing:  Category I Pain Control:  Epidural Anticipated MOD:  Vaginal #GBS not done > Amp #Limited prenatal care: HBsAg non reactive, plan for SW postpartum.   Heather Mattocks, DO 10/21/2021, 2:31 AM PGY-1, [facility]  GME ATTESTATION:  I saw and evaluated the patient. I agree with the findings and the plan of care as documented in the resident's note. I have made changes to documentation as necessary.  Heather Field, MD OB Fellow, Faculty [facility], Center for [facility] 10/21/2021 7:38 AM

## 2021-10-21 NOTE — Anesthesia Procedure Notes (Addendum)
Epidural Patient location during procedure: OB Start time: 10/21/2021 1:34 AM End time: 10/21/2021 1:40 AM  Staffing Anesthesiologist: Bethena Midget, MD  Preanesthetic Checklist Completed: patient identified, IV checked, site marked, risks and benefits discussed, surgical consent, monitors and equipment checked, pre-op evaluation and timeout performed  Epidural Patient position: sitting Prep: DuraPrep and site prepped and draped Patient monitoring: continuous pulse ox and blood pressure Approach: midline Location: L4-L5 Injection technique: LOR air  Needle:  Needle type: Tuohy  Needle gauge: 17 G Needle length: 9 cm and 9 Needle insertion depth: 7 cm Catheter type: closed end flexible Catheter size: 19 Gauge Catheter at skin depth: 13 cm Test dose: negative  Assessment Events: blood not aspirated, injection not painful, no injection resistance, no paresthesia and negative IV test

## 2021-10-22 ENCOUNTER — Encounter: Payer: Self-pay | Admitting: Family Medicine

## 2021-10-22 DIAGNOSIS — O99820 Streptococcus B carrier state complicating pregnancy: Secondary | ICD-10-CM | POA: Insufficient documentation

## 2021-10-22 LAB — SURGICAL PATHOLOGY

## 2021-10-22 MED ORDER — ACETAMINOPHEN 500 MG PO TABS: 1000.0000 mg | ORAL_TABLET | Freq: Three times a day (TID) | ORAL | 0 refills | Status: DC | PRN

## 2021-10-22 MED ORDER — SODIUM CHLORIDE 0.9 % IV SOLN
500.0000 mg | Freq: Once | INTRAVENOUS | Status: AC
  Administered 2021-10-22: 500 mg via INTRAVENOUS
  Filled 2021-10-22: qty 25

## 2021-10-22 MED ORDER — FERROUS SULFATE 325 (65 FE) MG PO TABS: 325.0000 mg | ORAL_TABLET | ORAL | 1 refills | Status: DC

## 2021-10-22 MED ORDER — IBUPROFEN 600 MG PO TABS: 600.0000 mg | ORAL_TABLET | Freq: Four times a day (QID) | ORAL | 0 refills | Status: DC | PRN

## 2021-10-22 NOTE — Clinical Social Work Maternal (Signed)
CLINICAL SOCIAL WORK MATERNAL/CHILD NOTE  Patient Details  Name: Heather Kim MRN: [id number] Date of Birth: 06/23/1986  Date:  10/22/2021  Clinical Social Worker Initiating Note:  Candy Ziegler, LCSW Date/Time: Initiated:  10/22/21/1154     Child's Name:  Heather Kim   Biological Parents:  Mother: Heather Kim  Need for Interpreter:  None   Reason for Referral:  Current Substance Use/Substance Use During Pregnancy     Address:  [address] [place] [address]    Phone number:  [phone] (home)     Additional phone number:   Household Members/Support Persons (HM/SP):   Household Member/Support Person 1, Household Member/Support Person 2, Household Member/Support Person 3, Household Member/Support Person 4, Household Member/Support Person 5   HM/SP Name Relationship DOB or Age  HM/SP -1 Harmony Taylor Daughter 11  HM/SP -2 Zayvier Taylor Son 8  HM/SP -3 Ky'yon Son 8  HM/SP -4 Ayrrion Taylor Son 4  HM/SP -5 Foreva Taylor Daughter 2  HM/SP -6        HM/SP -7        HM/SP -8          Natural Supports (not living in the home):  Immediate Family   Professional Supports: None   Employment: Full-time   Type of Work: [place]   Education:  Other (comment) (GED)   Homebound arranged:    Financial Resources:  Medicaid   Other Resources:  Food Stamps  , WIC   Cultural/Religious Considerations Which May Impact Care:    Strengths:  Ability to meet basic needs  , Home prepared for child  , Pediatrician chosen   Psychotropic Medications:         Pediatrician:    [place] area  Pediatrician List:   [place] [facility] ([address])  [place]    [place]    [place]    [place]    [place]      Pediatrician Fax Number:    Risk Factors/Current Problems:  Substance Use     Cognitive State:  Able to Concentrate  , Alert  , Linear Thinking  , Insightful      Mood/Affect:  Comfortable  , Calm  , Relaxed  , Bright  , Happy     CSW Assessment: CSW received consult for No PNC and THC use.  CSW met with MOB to offer support and complete assessment.    CSW met with MOB at bedside and introduced CSW role. CSW observed MOB breastfeeding the infant and congratulated MOB. MOB presented pleasant and receptive to CSW visit. MOB confirmed the hospital has the correct demographic information on file. MOB reported she resides in the home with herself and children (see chart above). MOB reported FOB is not involved, so did not provide his information. MOB acknowledged her twin sister as a primary support and her mom, sometimes. CSW inquired how MOB has felt since giving birth. MOB reported she has felt great since giving birth. She shared the L&D was different compared to her previous births because of the long contractions and during the pregnancy she experienced daily heartburn. CSW assessed how MOB felt emotionally during the pregnancy. MOB shared she felt good emotionally, happy most of time. CSW inquired why MOB received no prenatal care. MOB reported she did not have childcare for her 4- and 2-year-old children. CSW informed MOB about the hospital drug screen policy for no PNC. CSW inquired if MOB   used substances during the pregnancy. MOB disclosed she used marijuana throughout the pregnancy and her last use was 2-3 weeks ago. MOB reported marijuana use because she had difficulty eating and it helped stimulate an appetite. MOB made aware that CSW will make a report to CPS if the infant's UDS or CDS result positive for substances. MOB reported understanding. MOB disclosed she had CPS history four years ago because her son urine was positive for marijuana. MOB reported the case was closed.   CSW inquired about MOB mental health history. MOB denied mental health history although she stated she received counseling two years ago at [employer] which helped  her process childhood trauma. MOB discussed the positive impact it has had on her parenting and relationships with each her children. CSW praised MOB for her efforts. MOB reported no current concerns.  CSW inquired if MOB experienced PPD. MOB shared, she experienced PPD four year ago as evidenced by not feeling attached and not bonding immediately with the infant. MOB reported during this time she had several stressors that contributed to her low mood. MOB reported the stressors have been resolved. CSW provided education regarding the baby blues period vs. perinatal mood disorders, discussed treatment and gave resources for mental health follow up if concerns arise.  CSW recommends self-evaluation during the postpartum time period using the New Mom Checklist from Postpartum Progress and encouraged MOB to contact a medical professional if symptoms are noted at any time. MOB reported she feels comfortable returning to the hospital if concerns arise.   CSW shared she has a bassinet and car seat for the infant with limited diapers and wipes. MOB reported she receives WIC/FS benefits. CSW provided education about [employer] program. MOB reported familiarity with the program and gave CSW permission to make a referral. CSW provided review of Sudden Infant Death Syndrome (SIDS) precautions. MOB was very knowledgeable of the SIDS precautions as it pertained to smoking. MOB has chosen [facility] for infant's follow up care. MOB reported she will have transportation to the first appointment but will need assistance other appointments. CSW educated MOB about [place] transportation. MOB completed rider waiver form.   -CSW will monitor infant's UDS, CDS and make a report, if warranted.   CSW identifies no further need for intervention and no barriers to discharge at this time.  CSW Plan/Description:  CSW Will Continue to Monitor Umbilical Cord Tissue Drug Screen Results and Make Report if  Warranted, Sudden Infant Death Syndrome (SIDS) Education, [place], Perinatal Mood and Anxiety Disorder (PMADs) Education, No Further Intervention Required/No Barriers to Discharge    Lia Hopping, LCSW 10/22/2021, 1:44PM

## 2021-10-23 LAB — CULTURE, BETA STREP (GROUP B ONLY)

## 2021-11-01 ENCOUNTER — Telehealth (HOSPITAL_COMMUNITY): Payer: Self-pay

## 2021-11-01 NOTE — Telephone Encounter (Signed)
No answer.  Marcelino [facility] 11/01/2021,1217

## 2021-11-06 ENCOUNTER — Encounter: Payer: Self-pay | Admitting: Family Medicine

## 2021-11-06 ENCOUNTER — Ambulatory Visit: Payer: Medicaid Other | Admitting: Family Medicine

## 2021-11-06 ENCOUNTER — Ambulatory Visit (INDEPENDENT_AMBULATORY_CARE_PROVIDER_SITE_OTHER): Payer: Medicaid Other | Admitting: Family Medicine

## 2021-11-06 ENCOUNTER — Other Ambulatory Visit: Payer: Self-pay

## 2021-11-06 DIAGNOSIS — Z302 Encounter for sterilization: Secondary | ICD-10-CM

## 2021-11-06 NOTE — Assessment & Plan Note (Addendum)
Patient counseled, r.e. Risks benefits of BTL, including permanency of procedure, salpingectomy which gives decrease in ovarian cancer risk. Patient verbalized understanding and desires to proceed. Papers signed. She is certain she does not want more children.Will book for laparoscopic salpingectomy. Risks include but are not limited to bleeding, infection, injury to surrounding structures, including bowel, bladder and ureters, blood clots, and death.  Likelihood of success is high.

## 2021-11-06 NOTE — Progress Notes (Signed)
   Subjective:    Patient ID: Heather Kim is a 34 y.o. female presenting with Consultation for BTL  on 11/06/2021  HPI: Here for BTL. She is a [id number] with SVD x 6. No h/o surgery. Limited PNC and no BTL papers signed. She does desire a BTL strongly.  Review of Systems  Constitutional:  Negative for chills and fever.  Respiratory:  Negative for shortness of breath.   Cardiovascular:  Negative for chest pain.  Gastrointestinal:  Negative for abdominal pain, nausea and vomiting.  Genitourinary:  Negative for dysuria.  Skin:  Negative for rash.     Objective:    BP 114/77   Pulse 70   Wt 198 lb 14.4 oz (90.2 kg)   BMI 34.14 kg/m  Physical Exam Constitutional:      General: She is not in acute distress.    Appearance: She is well-developed.  HENT:     Head: Normocephalic and atraumatic.  Eyes:     General: No scleral icterus. Cardiovascular:     Rate and Rhythm: Normal rate.  Pulmonary:     Effort: Pulmonary effort is normal.  Abdominal:     Palpations: Abdomen is soft.  Musculoskeletal:     Cervical back: Neck supple.  Skin:    General: Skin is warm and dry.  Neurological:     Mental Status: She is alert and oriented to person, place, and time.        Assessment & Plan:   Problem List Items Addressed This Visit       Unprioritized   Encounter for sterilization    Patient counseled, r.e. Risks benefits of BTL, including permanency of procedure, salpingectomy which gives decrease in ovarian cancer risk. Patient verbalized understanding and desires to proceed. Papers signed. She is certain she does not want more children.Will book for laparoscopic salpingectomy. Risks include but are not limited to bleeding, infection, injury to surrounding structures, including bowel, bladder and ureters, blood clots, and death.  Likelihood of success is high.        Return in about 8 weeks (around 01/01/2022) for postop check.  Reva Bores 11/06/2021 3:03 PM

## 2021-11-07 ENCOUNTER — Encounter: Payer: Self-pay | Admitting: *Deleted

## 2021-11-13 ENCOUNTER — Telehealth: Payer: Self-pay

## 2021-11-13 NOTE — Telephone Encounter (Signed)
Called to discuss potential surgery dates, no answer, unable to leave voicemail. Will schedule on first available

## 2021-11-24 NOTE — H&P (Signed)
Heather Kim is an 35 y.o. [phone] female.   Chief Complaint: undesired fertility HPI: Patient reports desire for sterilization. She has 6 children and feels her family is complete.  Past Medical History:  Diagnosis Date   Anxiety    Depression    Migraine headache     Past Surgical History:  Procedure Laterality Date   NO PAST SURGERIES      Family History  Problem Relation Age of Onset   Deep vein thrombosis Mother    Cancer Father    Social History:  reports that she has never smoked. She has never used smokeless tobacco. She reports current drug use. Drug: Marijuana. She reports that she does not drink alcohol.  Allergies: No Known Allergies  No medications prior to admission.    Pertinent items are noted in HPI.  unknown if currently breastfeeding. General appearance: alert, cooperative, and appears stated age Head: Normocephalic, without obvious abnormality, atraumatic Neck: supple, symmetrical, trachea midline Lungs:  normal effort Heart: regular rate and rhythm Abdomen: soft, non-tender; bowel sounds normal; no masses,  no organomegaly Extremities: extremities normal, atraumatic, no cyanosis or edema Skin: Skin color, texture, turgor normal. No rashes or lesions Neurologic: Grossly normal   Lab Results  Component Value Date   WBC 12.7 (H) 10/21/2021   HGB 8.1 (L) 10/21/2021   HCT 26.3 (L) 10/21/2021   MCV 74.9 (L) 10/21/2021   PLT 193 10/21/2021   Lab Results  Component Value Date   PREGTESTUR POSITIVE (A) 07/06/2021   HCG 22.09 12/11/2015     Assessment/Plan Principal Problem:   Encounter for sterilization  Patient counseled, r.e. Risks benefits of BTL, including permanency of procedure. Patient desires salpingectomy for decreased risk of ovarian cancer.  Patient verbalized understanding and desires to proceed  Risks include but are not limited to bleeding, infection, injury to surrounding structures, including bowel, bladder and ureters,  blood clots, and death.  Likelihood of success is high.    Reva Bores 11/24/2021, 3:31 PM

## 2021-11-25 ENCOUNTER — Other Ambulatory Visit (HOSPITAL_COMMUNITY)
Admission: RE | Admit: 2021-11-25 | Discharge: 2021-11-25 | Disposition: A | Discharge status: Home / Self Care | Payer: Medicaid Other | Source: Ambulatory Visit | Attending: Obstetrics and Gynecology | Admitting: Obstetrics and Gynecology

## 2021-11-25 ENCOUNTER — Other Ambulatory Visit: Payer: Self-pay

## 2021-11-25 ENCOUNTER — Ambulatory Visit (INDEPENDENT_AMBULATORY_CARE_PROVIDER_SITE_OTHER): Payer: Medicaid Other | Admitting: Obstetrics and Gynecology

## 2021-11-25 ENCOUNTER — Encounter: Payer: Self-pay | Admitting: Obstetrics and Gynecology

## 2021-11-25 VITALS — BP 123/86 | HR 72 | Wt 208.3 lb

## 2021-11-25 DIAGNOSIS — Z124 Encounter for screening for malignant neoplasm of cervix: Secondary | ICD-10-CM | POA: Diagnosis not present

## 2021-11-25 MED ORDER — BUTALBITAL-APAP-CAFFEINE 50-325-40 MG PO TABS: 1.0000 | ORAL_TABLET | Freq: Four times a day (QID) | ORAL | 0 refills | Status: AC | PRN

## 2021-11-25 NOTE — Progress Notes (Signed)
Post Partum Visit Note  Heather Kim is a 35 y.o. [phone] female who presents for a postpartum visit. She is 5 weeks postpartum following a normal spontaneous vaginal delivery.  I have fully reviewed the prenatal and intrapartum course. The delivery was at 38.3 gestational weeks.  Anesthesia: epidural. Postpartum course has been uncomplicated.  Baby is doing well. Baby is feeding by bottle Daron Offer . Bleeding no bleeding. Bowel function is normal. Bladder function is normal. Patient is not sexually active. Contraception method is none. Postpartum depression screening: negative. She is scheduled for BTL on 12/08/2021   The pregnancy intention screening data noted above was reviewed. Potential methods of contraception were discussed. The patient elected to proceed with No data recorded.   Edinburgh Postnatal Depression Scale - 11/25/21 1110       Edinburgh Postnatal Depression Scale:  In the Past 7 Days   I have been able to laugh and see the funny side of things. 0    I have looked forward with enjoyment to things. 0    I have blamed myself unnecessarily when things went wrong. 0    I have been anxious or worried for no good reason. 0    I have felt scared or panicky for no good reason. 0    Things have been getting on top of me. 0    I have been so unhappy that I have had difficulty sleeping. 0    I have felt sad or miserable. 0    I have been so unhappy that I have been crying. 0    The thought of harming myself has occurred to me. 0    Edinburgh Postnatal Depression Scale Total 0             Health Maintenance Due  Topic Date Due   COVID-19 Vaccine (1) Never done   Hepatitis C Screening  Never done   PAP SMEAR-Modifier  12/10/2018    The following portions of the patient's history were reviewed and updated as appropriate: allergies, current medications, past family history, past medical history, past social history, past surgical history, and problem  list.  Review of Systems Pertinent items are noted in HPI.  Objective:  BP 123/86   Pulse 72   Wt 208 lb 4.8 oz (94.5 kg)   Breastfeeding No   BMI 35.75 kg/m    General:  alert and cooperative, A & O x 3.   Lungs: clear to auscultation bilaterally  Heart:  regular rate and rhythm, S1, S2 normal, no murmur, click, rub or gallop  Abdomen: soft, non-tender; bowel sounds normal; no masses,  no organomegaly   GU EXAM:    Vagina - Small amount of white, yellow,  vaginal discharge, no odor  Cervix - + small contact bleeding, Pap collected  Bimanual exam: Uterus non tender, slightly enlarged  Adnexa non tender, no masses bilaterally Chaperone present for exam.         Assessment:   Normal postpartum exam.  PAP collected BTL scheduled Occasional HA's: Has tried tylenol and ibuprofen. HA's come and go. BP today is normal and has been throughout PP course. RX: fioricet, if no improvement patient should contact office.   Plan:   Essential components of care per ACOG recommendations:  1.  Mood and well being: Patient with negative depression screening today. Reviewed local resources for support.  - Patient tobacco use? No.   - hx of drug use? No.    2.  Infant care and feeding:  -Patient currently breastmilk feeding? No, bottle.  -Social determinants of health (SDOH) reviewed in EPIC. No concerns. The following needs were identified: NONE  3. Sexuality, contraception and birth spacing - Patient does not want a pregnancy in the next year.  Desired family size is 6 children.  - Reviewed forms of contraception in tiered fashion. Patient desired bilateral tubal ligation today.   - Discussed birth spacing of 18 months  4. Sleep and fatigue -Encouraged family/partner/community support of 4 hrs of uninterrupted sleep to help with mood and fatigue  5. Physical Recovery  - Discussed patients delivery and complications. She describes her labor as good. - Patient had a Vaginal, no  problems at delivery. Patient had a 2nd degree laceration. Perineal healing reviewed. Patient expressed understanding - Patient has urinary incontinence? No. - Patient is not safe to resume physical and sexual activity  6.  Health Maintenance - HM due items addressed Yes - Last pap smear No results found for: DIAGPAP Pap smear done at today's visit.  -Breast Cancer screening indicated? No.   7. Chronic Disease/Pregnancy Condition follow up: None  - PCP follow up  Venia Carbon, NP Center for [employer], [facility]

## 2021-11-28 LAB — CYTOLOGY - PAP
Comment: NEGATIVE
Diagnosis: NEGATIVE
Diagnosis: REACTIVE
High risk HPV: NEGATIVE

## 2021-12-03 ENCOUNTER — Encounter (HOSPITAL_BASED_OUTPATIENT_CLINIC_OR_DEPARTMENT_OTHER): Payer: Self-pay | Admitting: Family Medicine

## 2021-12-04 ENCOUNTER — Encounter (HOSPITAL_COMMUNITY): Payer: Self-pay

## 2021-12-04 NOTE — Progress Notes (Addendum)
I have made multiple attempts to contact patient via phone # in Epic. There is no answer after multiple rings, then a recording says that the call cannot be completed at this time and to please try again later. I tried calling the phone # we have for her emergency contact (sister) x2. There was no answer. I spoke with the receptionist at [facility], she did not have another phone number. She stated that she would send her a Mychart message requesting that she call us today if possible, or tomorrow so we could do her pre-op phone call.  Another Mychart message requesting pt to call was sent by Cain Sieve, RN on 12/04/21.

## 2021-12-05 NOTE — Progress Notes (Signed)
Sent secure chat to dr Shawnie Pons and Billie Ruddy unable to reach patient for 12-08-2021 surgery medical history and instructions

## 2021-12-07 NOTE — Anesthesia Preprocedure Evaluation (Addendum)
Anesthesia Evaluation  Patient identified by MRN, date of birth, ID band Patient awake    Reviewed: Allergy & Precautions, NPO status , Patient's Chart, lab work & pertinent test results  Airway Mallampati: II  TM Distance: >3 FB     Dental   Pulmonary neg pulmonary ROS,    breath sounds clear to auscultation       Cardiovascular negative cardio ROS   Rhythm:Regular Rate:Normal     Neuro/Psych  Headaches,    GI/Hepatic negative GI ROS, Neg liver ROS,   Endo/Other  negative endocrine ROS  Renal/GU negative Renal ROS     Musculoskeletal   Abdominal   Peds  Hematology   Anesthesia Other Findings   Reproductive/Obstetrics                             Anesthesia Physical Anesthesia Plan  ASA: 2  Anesthesia Plan: General   Post-op Pain Management:    Induction: Intravenous  PONV Risk Score and Plan: 3 and Ondansetron, Dexamethasone and Midazolam  Airway Management Planned: Oral ETT  Additional Equipment:   Intra-op Plan:   Post-operative Plan: Extubation in OR  Informed Consent: I have reviewed the patients History and Physical, chart, labs and discussed the procedure including the risks, benefits and alternatives for the proposed anesthesia with the patient or authorized representative who has indicated his/her understanding and acceptance.     Dental advisory given  Plan Discussed with: CRNA and Anesthesiologist  Anesthesia Plan Comments:         Anesthesia Quick Evaluation

## 2021-12-08 ENCOUNTER — Ambulatory Visit (HOSPITAL_BASED_OUTPATIENT_CLINIC_OR_DEPARTMENT_OTHER): Payer: Medicaid Other | Admitting: Anesthesiology

## 2021-12-08 ENCOUNTER — Encounter (HOSPITAL_BASED_OUTPATIENT_CLINIC_OR_DEPARTMENT_OTHER): Payer: Self-pay | Admitting: Family Medicine

## 2021-12-08 ENCOUNTER — Ambulatory Visit (HOSPITAL_BASED_OUTPATIENT_CLINIC_OR_DEPARTMENT_OTHER)
Admission: RE | Admit: 2021-12-08 | Discharge: 2021-12-08 | Disposition: A | Discharge status: Home / Self Care | Payer: Medicaid Other | Attending: Family Medicine | Admitting: Family Medicine

## 2021-12-08 ENCOUNTER — Encounter (HOSPITAL_BASED_OUTPATIENT_CLINIC_OR_DEPARTMENT_OTHER)
Admission: RE | Disposition: A | Discharge status: Home / Self Care | Payer: Self-pay | Source: Home / Self Care | Attending: Family Medicine

## 2021-12-08 DIAGNOSIS — Z302 Encounter for sterilization: Secondary | ICD-10-CM | POA: Diagnosis not present

## 2021-12-08 DIAGNOSIS — G43909 Migraine, unspecified, not intractable, without status migrainosus: Secondary | ICD-10-CM | POA: Diagnosis not present

## 2021-12-08 HISTORY — PX: LAPAROSCOPIC BILATERAL SALPINGECTOMY: SHX5889

## 2021-12-08 LAB — CBC
HCT: 36 % (ref 36.0–46.0)
Hemoglobin: 11 g/dL — ABNORMAL LOW (ref 12.0–15.0)
MCH: 24.6 pg — ABNORMAL LOW (ref 26.0–34.0)
MCHC: 30.6 g/dL (ref 30.0–36.0)
MCV: 80.4 fL (ref 80.0–100.0)
Platelets: 192 10*3/uL (ref 150–400)
RBC: 4.48 MIL/uL (ref 3.87–5.11)
RDW: 20.5 % — ABNORMAL HIGH (ref 11.5–15.5)
WBC: 5 10*3/uL (ref 4.0–10.5)
nRBC: 0 % (ref 0.0–0.2)

## 2021-12-08 LAB — POCT PREGNANCY, URINE: Preg Test, Ur: NEGATIVE

## 2021-12-08 SURGERY — SALPINGECTOMY, BILATERAL, LAPAROSCOPIC
Anesthesia: General | Site: Abdomen | Laterality: Bilateral

## 2021-12-08 MED ORDER — LIDOCAINE HCL (CARDIAC) PF 100 MG/5ML IV SOSY
PREFILLED_SYRINGE | INTRAVENOUS | Status: DC | PRN
  Administered 2021-12-08: 80 mg via INTRAVENOUS

## 2021-12-08 MED ORDER — SUGAMMADEX SODIUM 200 MG/2ML IV SOLN
INTRAVENOUS | Status: DC | PRN
  Administered 2021-12-08: 200 mg via INTRAVENOUS

## 2021-12-08 MED ORDER — ONDANSETRON HCL 4 MG/2ML IJ SOLN
INTRAMUSCULAR | Status: AC
  Filled 2021-12-08: qty 2

## 2021-12-08 MED ORDER — HEMOSTATIC AGENTS (NO CHARGE) OPTIME
TOPICAL | Status: DC | PRN
  Administered 2021-12-08: 1

## 2021-12-08 MED ORDER — DEXAMETHASONE SODIUM PHOSPHATE 4 MG/ML IJ SOLN
INTRAMUSCULAR | Status: DC | PRN
  Administered 2021-12-08: 8 mg via INTRAVENOUS

## 2021-12-08 MED ORDER — ONDANSETRON HCL 4 MG/2ML IJ SOLN
INTRAMUSCULAR | Status: DC | PRN
  Administered 2021-12-08: 4 mg via INTRAVENOUS

## 2021-12-08 MED ORDER — MIDAZOLAM HCL 5 MG/5ML IJ SOLN
INTRAMUSCULAR | Status: DC | PRN
  Administered 2021-12-08: 2 mg via INTRAVENOUS

## 2021-12-08 MED ORDER — DEXAMETHASONE SODIUM PHOSPHATE 10 MG/ML IJ SOLN
INTRAMUSCULAR | Status: AC
  Filled 2021-12-08: qty 1

## 2021-12-08 MED ORDER — KETOROLAC TROMETHAMINE 30 MG/ML IJ SOLN
INTRAMUSCULAR | Status: AC
  Filled 2021-12-08: qty 1

## 2021-12-08 MED ORDER — BUPIVACAINE HCL (PF) 0.25 % IJ SOLN
INTRAMUSCULAR | Status: DC | PRN
  Administered 2021-12-08: 10 mL

## 2021-12-08 MED ORDER — GLYCOPYRROLATE PF 0.2 MG/ML IJ SOSY
PREFILLED_SYRINGE | INTRAMUSCULAR | Status: AC
  Filled 2021-12-08: qty 1

## 2021-12-08 MED ORDER — PROPOFOL 10 MG/ML IV BOLUS
INTRAVENOUS | Status: AC
  Filled 2021-12-08: qty 20

## 2021-12-08 MED ORDER — FENTANYL CITRATE (PF) 100 MCG/2ML IJ SOLN
INTRAMUSCULAR | Status: AC
  Filled 2021-12-08: qty 2

## 2021-12-08 MED ORDER — GLYCOPYRROLATE 0.2 MG/ML IJ SOLN
INTRAMUSCULAR | Status: DC | PRN
  Administered 2021-12-08: .2 mg via INTRAVENOUS

## 2021-12-08 MED ORDER — GABAPENTIN 300 MG PO CAPS
ORAL_CAPSULE | ORAL | Status: AC
  Filled 2021-12-08: qty 1

## 2021-12-08 MED ORDER — PROPOFOL 10 MG/ML IV BOLUS
INTRAVENOUS | Status: DC | PRN
  Administered 2021-12-08: 170 mg via INTRAVENOUS

## 2021-12-08 MED ORDER — MIDAZOLAM HCL 2 MG/2ML IJ SOLN
INTRAMUSCULAR | Status: AC
  Filled 2021-12-08: qty 2

## 2021-12-08 MED ORDER — LACTATED RINGERS IV SOLN: INTRAVENOUS | Status: DC

## 2021-12-08 MED ORDER — ACETAMINOPHEN 500 MG PO TABS
1000.0000 mg | ORAL_TABLET | ORAL | Status: AC
  Administered 2021-12-08: 1000 mg via ORAL

## 2021-12-08 MED ORDER — FENTANYL CITRATE (PF) 100 MCG/2ML IJ SOLN: 25.0000 ug | INTRAMUSCULAR | Status: DC | PRN

## 2021-12-08 MED ORDER — ROCURONIUM BROMIDE 100 MG/10ML IV SOLN
INTRAVENOUS | Status: DC | PRN
  Administered 2021-12-08: 60 mg via INTRAVENOUS

## 2021-12-08 MED ORDER — CELECOXIB 200 MG PO CAPS
ORAL_CAPSULE | ORAL | Status: AC
  Filled 2021-12-08: qty 2

## 2021-12-08 MED ORDER — CELECOXIB 200 MG PO CAPS
400.0000 mg | ORAL_CAPSULE | ORAL | Status: AC
  Administered 2021-12-08: 400 mg via ORAL

## 2021-12-08 MED ORDER — OXYCODONE HCL 5 MG PO TABS: 5.0000 mg | ORAL_TABLET | Freq: Four times a day (QID) | ORAL | 0 refills | Status: AC | PRN

## 2021-12-08 MED ORDER — LIDOCAINE 2% (20 MG/ML) 5 ML SYRINGE
INTRAMUSCULAR | Status: AC
  Filled 2021-12-08: qty 5

## 2021-12-08 MED ORDER — ROCURONIUM BROMIDE 10 MG/ML (PF) SYRINGE
PREFILLED_SYRINGE | INTRAVENOUS | Status: AC
  Filled 2021-12-08: qty 10

## 2021-12-08 MED ORDER — GABAPENTIN 300 MG PO CAPS
300.0000 mg | ORAL_CAPSULE | ORAL | Status: AC
  Administered 2021-12-08: 300 mg via ORAL

## 2021-12-08 MED ORDER — FENTANYL CITRATE (PF) 100 MCG/2ML IJ SOLN
INTRAMUSCULAR | Status: DC | PRN
  Administered 2021-12-08 (×3): 50 ug via INTRAVENOUS

## 2021-12-08 MED ORDER — ACETAMINOPHEN 500 MG PO TABS
ORAL_TABLET | ORAL | Status: AC
  Filled 2021-12-08: qty 2

## 2021-12-08 MED ORDER — 0.9 % SODIUM CHLORIDE (POUR BTL) OPTIME
TOPICAL | Status: DC | PRN
  Administered 2021-12-08: 500 mL

## 2021-12-08 MED ORDER — POVIDONE-IODINE 10 % EX SWAB: 2.0000 "application " | Freq: Once | CUTANEOUS | Status: DC

## 2021-12-08 SURGICAL SUPPLY — 34 items
ADH SKN CLS APL DERMABOND .7 (GAUZE/BANDAGES/DRESSINGS) ×1
APL SRG 38 LTWT LNG FL B (MISCELLANEOUS) ×1
APPLICATOR ARISTA FLEXITIP XL (MISCELLANEOUS) ×1 IMPLANT
BLADE SURG 15 STRL LF DISP TIS (BLADE) ×1 IMPLANT
BLADE SURG 15 STRL SS (BLADE) ×2
COVER MAYO STAND STRL (DRAPES) ×2 IMPLANT
DERMABOND ADVANCED (GAUZE/BANDAGES/DRESSINGS) ×1
DERMABOND ADVANCED .7 DNX12 (GAUZE/BANDAGES/DRESSINGS) ×1 IMPLANT
DURAPREP 26ML APPLICATOR (WOUND CARE) ×2 IMPLANT
GAUZE 4X4 16PLY ~~LOC~~+RFID DBL (SPONGE) ×2 IMPLANT
GLOVE SURG LTX SZ7 (GLOVE) ×2 IMPLANT
GLOVE SURG POLYISO LF SZ7 (GLOVE) ×1 IMPLANT
GLOVE SURG UNDER POLY LF SZ7 (GLOVE) ×8 IMPLANT
GOWN STRL REUS W/TWL LRG LVL3 (GOWN DISPOSABLE) ×1 IMPLANT
GOWN STRL REUS W/TWL XL LVL3 (GOWN DISPOSABLE) ×3 IMPLANT
HEMOSTAT ARISTA ABSORB 3G PWDR (HEMOSTASIS) ×1 IMPLANT
KIT TURNOVER CYSTO (KITS) ×2 IMPLANT
NS IRRIG 500ML POUR BTL (IV SOLUTION) ×1 IMPLANT
PACK LAPAROSCOPY BASIN (CUSTOM PROCEDURE TRAY) ×2 IMPLANT
PACK TRENDGUARD 450 HYBRID PRO (MISCELLANEOUS) IMPLANT
PAD OB MATERNITY 4.3X12.25 (PERSONAL CARE ITEMS) ×2 IMPLANT
PAD PREP 24X48 CUFFED NSTRL (MISCELLANEOUS) ×2 IMPLANT
SET TUBE SMOKE EVAC HIGH FLOW (TUBING) ×2 IMPLANT
SHEARS HARMONIC ACE PLUS 36CM (ENDOMECHANICALS) ×1 IMPLANT
SLEEVE SCD COMPRESS KNEE MED (STOCKING) ×2 IMPLANT
SLEEVE XCEL OPT CAN 5 100 (ENDOMECHANICALS) ×1 IMPLANT
SUT VIC AB 3-0 X1 27 (SUTURE) ×2 IMPLANT
SUT VICRYL 0 UR6 27IN ABS (SUTURE) ×2 IMPLANT
TOWEL OR 17X26 10 PK STRL BLUE (TOWEL DISPOSABLE) ×2 IMPLANT
TRAY FOLEY W/BAG SLVR 14FR LF (SET/KITS/TRAYS/PACK) ×1 IMPLANT
TRENDGUARD 450 HYBRID PRO PACK (MISCELLANEOUS) ×2
TROCAR BALLN 12MMX100 BLUNT (TROCAR) ×2 IMPLANT
TROCAR BLADELESS OPT 5 100 (ENDOMECHANICALS) ×3 IMPLANT
WARMER LAPAROSCOPE (MISCELLANEOUS) ×2 IMPLANT

## 2021-12-08 NOTE — Op Note (Signed)
PROCEDURE DATE: 12/08/2021  PREOPERATIVE DIAGNOSES: Undesired fertility  POSTOPERATIVE DIAGNOSES: The same   PROCEDURE: Laparoscopic bilateral salpingctomy   SURGEON: Dr. Reva Bores   ASSISTANT: None  ANESTHESIOLOGIST: Dorris Singh, MD MD - GETT  INDICATIONS: 35 y.o. [id number] who desired permanent sterilization.  FINDINGS: Normal appearing uterus, tubes and ovaries   ESTIMATED BLOOD LOSS: 25 ml   SPECIMENS: Bilateral fallopian tubes to pathology  COMPLICATIONS: None immediately known   PROCEDURE IN DETAIL: The patient had sequential compression devices applied to her lower extremities while in the preoperative area. She was then taken to the operating room where general anesthesia was administered and was found to be adequate. She was placed in the dorsal lithotomy position, and was prepped and draped in a sterile manner. A foley catheter was inserted into her bladder and drained a clear unknown amount of urine. A speculum was placed inside the vagina, and the cervix grasped with an single tooth tenaculum. A Hulka tenaculum was placed through the cervix for uterine manipulation. Attention was then turned to the patient's abdomen where a 11-mm skin incision was made in the umbilicus.  This was carried down to the underlying fascia and peritoneum.  The fascia was tagged with 0 Vicryl suture on a UR-6. Intraperitoneal placement was confirmed and insufflation done. A survey of the patient's pelvis and abdomen revealed the findings above. Two left sided 5-mm lower quadrant ports were then placed under direct visualization. On the right side, the tube was separated from the mesosalpinx with the Harmonic device.  On the left side, the tube was separated from the mesosalpinx with the Harmonic device.  Some bleeding noted on the right and the harmonic with cautery only achieved hemostasis. The specimen was then removed from the abdomen through the 11-mm port, under direct visualization. The  operative site was surveyed, and found to be hemostatic. Arista placed over both sites.  No intraoperative injury to other surrounding organs was noted. The abdomen was desufflated and all instruments were then removed from the patient's abdomen. Fascial closure was completed with aforementioned 0 Vicryl on a UR-6. All skin incisions were closed with 3-0 Vicryl subcuticular stitches and Dermabond.   Reva Bores MD 12/08/2021 10:04 AM

## 2021-12-08 NOTE — Anesthesia Postprocedure Evaluation (Signed)
Anesthesia Post Note  Patient: CYRENA KUCHENBECKER  Procedure(s) Performed: LAPAROSCOPIC BILATERAL SALPINGECTOMY (Bilateral: Abdomen)     Patient location during evaluation: PACU Anesthesia Type: General Level of consciousness: awake Pain management: pain level controlled Vital Signs Assessment: post-procedure vital signs reviewed and stable Respiratory status: spontaneous breathing Cardiovascular status: stable Postop Assessment: no apparent nausea or vomiting Anesthetic complications: no   No notable events documented.  Last Vitals:  Vitals:   12/08/21 1026 12/08/21 1030  BP: 116/87 119/79  Pulse:  65  Resp: 16 13  Temp:    SpO2:  99%    Last Pain:  Vitals:   12/08/21 1023  TempSrc:   PainSc: Asleep                 Maysie Parkhill

## 2021-12-08 NOTE — Transfer of Care (Signed)
Immediate Anesthesia Transfer of Care Note  Patient: Heather Kim  Procedure(s) Performed: LAPAROSCOPIC BILATERAL SALPINGECTOMY (Bilateral: Abdomen)  Patient Location: PACU  Anesthesia Type:General  Level of Consciousness: awake, alert  and oriented  Airway & Oxygen Therapy: Patient Spontanous Breathing and Patient connected to nasal cannula oxygen  Post-op Assessment: Report given to RN and Post -op Vital signs reviewed and stable  Post vital signs: Reviewed and stable  Last Vitals:  Vitals Value Taken Time  BP 116/87 12/08/21 1023  Temp    Pulse    Resp 13 12/08/21 1025  SpO2    Vitals shown include unvalidated device data.  Last Pain:  Vitals:   12/08/21 0821  TempSrc: Oral  PainSc: 0-No pain      Patients Stated Pain Goal: 6 (12/08/21 4536)  Complications: No notable events documented.

## 2021-12-08 NOTE — Discharge Instructions (Signed)
  Do not take any Tylenol or nonsteroidal anti inflammatories until after 2:30 pm today.    Post Anesthesia Home Care Instructions  Activity: Get plenty of rest for the remainder of the day. A responsible individual must stay with you for 24 hours following the procedure.  For the next 24 hours, DO NOT: -Drive a car -Advertising copywriter -Drink alcoholic beverages -Take any medication unless instructed by your physician -Make any legal decisions or sign important papers.  Meals: Start with liquid foods such as gelatin or soup. Progress to regular foods as tolerated. Avoid greasy, spicy, heavy foods. If nausea and/or vomiting occur, drink only clear liquids until the nausea and/or vomiting subsides. Call your physician if vomiting continues.  Special Instructions/Symptoms: Your throat may feel dry or sore from the anesthesia or the breathing tube placed in your throat during surgery. If this causes discomfort, gargle with warm salt water. The discomfort should disappear within 24 hours.  If you had a scopolamine patch placed behind your ear for the management of post- operative nausea and/or vomiting:  1. The medication in the patch is effective for 72 hours, after which it should be removed.  Wrap patch in a tissue and discard in the trash. Wash hands thoroughly with soap and water. 2. You may remove the patch earlier than 72 hours if you experience unpleasant side effects which may include dry mouth, dizziness or visual disturbances. 3. Avoid touching the patch. Wash your hands with soap and water after contact with the patch.

## 2021-12-08 NOTE — Interval H&P Note (Signed)
History and Physical Interval Note:  12/08/2021 8:53 AM  Heather Kim  has presented today for surgery, with the diagnosis of Undesired Fertility.  The various methods of treatment have been discussed with the patient and family. After consideration of risks, benefits and other options for treatment, the patient has consented to  Procedure(s): LAPAROSCOPIC BILATERAL SALPINGECTOMY (Bilateral) as a surgical intervention.  The patient's history has been reviewed, patient examined, no change in status, stable for surgery.  I have reviewed the patient's chart and labs.  Questions were answered to the patient's satisfaction.     Reva Bores

## 2021-12-08 NOTE — Anesthesia Procedure Notes (Signed)
Procedure Name: Intubation Date/Time: 12/08/2021 9:11 AM Performed by: Cleda Clarks, CRNA Pre-anesthesia Checklist: Patient identified, Emergency Drugs available, Suction available and Patient being monitored Patient Re-evaluated:Patient Re-evaluated prior to induction Oxygen Delivery Method: Circle system utilized Preoxygenation: Pre-oxygenation with 100% oxygen Induction Type: IV induction Ventilation: Mask ventilation without difficulty Laryngoscope Size: Miller and 2 Tube type: Oral Tube size: 7.0 mm Number of attempts: 1 Airway Equipment and Method: Stylet and Oral airway Placement Confirmation: ETT inserted through vocal cords under direct vision, positive ETCO2 and breath sounds checked- equal and bilateral Secured at: 21 cm Tube secured with: Tape Dental Injury: Teeth and Oropharynx as per pre-operative assessment

## 2021-12-09 ENCOUNTER — Encounter (HOSPITAL_BASED_OUTPATIENT_CLINIC_OR_DEPARTMENT_OTHER): Payer: Self-pay | Admitting: Family Medicine

## 2021-12-09 LAB — SURGICAL PATHOLOGY

## 2021-12-25 ENCOUNTER — Telehealth (INDEPENDENT_AMBULATORY_CARE_PROVIDER_SITE_OTHER): Payer: Medicaid Other | Admitting: Family Medicine

## 2021-12-25 ENCOUNTER — Encounter: Payer: Self-pay | Admitting: Family Medicine

## 2021-12-25 ENCOUNTER — Other Ambulatory Visit: Payer: Self-pay

## 2021-12-25 DIAGNOSIS — Z09 Encounter for follow-up examination after completed treatment for conditions other than malignant neoplasm: Secondary | ICD-10-CM

## 2021-12-25 NOTE — Progress Notes (Signed)
° ° °  GYNECOLOGY VIRTUAL VISIT ENCOUNTER NOTE  Provider location: [facility]   Patient location: Home  I connected with [name] on 12/25/21 at  2:55 PM EST by MyChart Video Encounter and verified that I am speaking with the correct person using two identifiers.   I discussed the limitations, risks, security and privacy concerns of performing an evaluation and management service virtually and the availability of in person appointments. I also discussed with the patient that there may be a patient responsible charge related to this service. The patient expressed understanding and agreed to proceed.   History:  Heather Kim is a 35 y.o. [phone] female being evaluated today for postop, following a bilateral salpingectomy. She is feeling well. Remains happy with her choice. She denies any abnormal vaginal discharge, bleeding, pelvic pain or other concerns.       Past Medical History:  Diagnosis Date   Anxiety    Depression    Migraine headache    PID (acute pelvic inflammatory disease) 07/15/2015   Past Surgical History:  Procedure Laterality Date   LAPAROSCOPIC BILATERAL SALPINGECTOMY Bilateral 12/08/2021   Procedure: LAPAROSCOPIC BILATERAL SALPINGECTOMY;  Surgeon: Reva Bores, MD;  Location: [facility] [place];  Service: Gynecology;  Laterality: Bilateral;   NO PAST SURGERIES     The following portions of the patient's history were reviewed and updated as appropriate: allergies, current medications, past family history, past medical history, past social history, past surgical history and problem list.   Health Maintenance:  Normal pap and negative HRHPV on 11/25/2021.    Review of Systems:  Pertinent items noted in HPI and remainder of comprehensive ROS otherwise negative.  Physical Exam:   General:  Alert, oriented and cooperative. Patient appears to be in no acute distress.  Mental Status: Normal mood and affect.  Normal behavior. Normal judgment and thought content.   Respiratory: Normal respiratory effort, no problems with respiration noted  Rest of physical exam deferred due to type of encounter  Labs and Imaging No results found for this or any previous visit (from the past 336 hour(s)). No results found.     Assessment and Plan:     1. Postop check Healing well. Pathology reviewed        I discussed the assessment and treatment plan with the patient. The patient was provided an opportunity to ask questions and all were answered. The patient agreed with the plan and demonstrated an understanding of the instructions.   The patient was advised to call back or seek an in-person evaluation/go to the ED if the symptoms worsen or if the condition fails to improve as anticipated.  I provided 5 minutes of face-to-face time during this encounter.   Reva Bores, MD [facility] for [employer], [facility]

## 2022-01-25 ENCOUNTER — Telehealth: Payer: Self-pay | Admitting: Internal Medicine

## 2022-01-25 ENCOUNTER — Ambulatory Visit
Admission: EM | Admit: 2022-01-25 | Discharge: 2022-01-25 | Disposition: A | Discharge status: Home / Self Care | Payer: Medicaid Other

## 2022-01-25 ENCOUNTER — Other Ambulatory Visit: Payer: Self-pay

## 2022-01-25 DIAGNOSIS — H538 Other visual disturbances: Secondary | ICD-10-CM | POA: Diagnosis not present

## 2022-01-25 MED ORDER — MOXIFLOXACIN HCL 0.5 % OP SOLN: 1.0000 [drp] | OPHTHALMIC | 1 refills | Status: AC

## 2022-01-25 MED ORDER — VALACYCLOVIR HCL 1 G PO TABS: 500.0000 mg | ORAL_TABLET | Freq: Three times a day (TID) | ORAL | 0 refills | Status: AC

## 2022-01-25 NOTE — ED Provider Notes (Signed)
[facility]    CSN: [id number] Arrival date & time: 01/25/22  1011      History   Chief Complaint Chief Complaint  Patient presents with   Blurred Vision    HPI Heather Kim is a 36 y.o. female.   Patient presents with blurry vision and bilateral eye pain with photosensitivity that started approximately 4 days ago.  Patient denies any obvious trauma or foreign bodies to the eye.  Denies any drainage from the eye.  Denies any associated upper respiratory symptoms.  Denies history of similar issue.  Patient does not wear contacts or glasses.  Denies any pertinent medical history including diabetes or hypertension.    Past Medical History:  Diagnosis Date   Anxiety    Depression    Migraine headache    PID (acute pelvic inflammatory disease) 07/15/2015    Patient Active Problem List   Diagnosis Date Noted   History of marijuana use 01/19/2019   Social problem 10/23/2017    Past Surgical History:  Procedure Laterality Date   LAPAROSCOPIC BILATERAL SALPINGECTOMY Bilateral 12/08/2021   Procedure: LAPAROSCOPIC BILATERAL SALPINGECTOMY;  Surgeon: Donnamae Jude, MD;  Location: [place];  Service: Gynecology;  Laterality: Bilateral;   NO PAST SURGERIES      OB History     Gravida  7   Para  6   Term  6   Preterm      AB  1   Living  6      SAB  1   IAB      Ectopic      Multiple  0   Live Births  6            Home Medications    Prior to Admission medications   Medication Sig Start Date End Date Taking? Authorizing Provider  butalbital-acetaminophen-caffeine (FIORICET) 50-325-40 MG tablet Take 1-2 tablets by mouth every 6 (six) hours as needed for headache. 11/25/21 11/25/22  Rasch, Anderson Malta I, NP  oxyCODONE (OXY IR/ROXICODONE) 5 MG immediate release tablet Take 1 tablet (5 mg total) by mouth every 6 (six) hours as needed for severe pain. Patient not taking: Reported on 12/25/2021 12/08/21   Donnamae Jude, MD     Family History Family History  Problem Relation Age of Onset   Deep vein thrombosis Mother    Cancer Father     Social History Social History   Tobacco Use   Smoking status: Never   Smokeless tobacco: Never  Vaping Use   Vaping Use: Never used  Substance Use Topics   Alcohol use: No   Drug use: Yes    Types: Marijuana    Comment: last used marijuana 2 weeks ago (10/20/21)     Allergies   Patient has no known allergies.   Review of Systems Review of Systems Per HPI  Physical Exam Triage Vital Signs ED Triage Vitals [01/25/22 1054]  Enc Vitals Group     BP 139/82     Pulse Rate 94     Resp 18     Temp 97.6 F (36.4 C)     Temp Source Oral     SpO2 99 %     Weight      Height      Head Circumference      Peak Flow      Pain Score 0     Pain Loc      Pain Edu?      Excl.  in [place]?    No data found.  Updated Vital Signs BP 139/82 (BP Location: Left Arm)    Pulse 94    Temp 97.6 F (36.4 C) (Oral)    Resp 18    SpO2 99%    Breastfeeding No   Visual Acuity Right Eye Distance: 20/200 Left Eye Distance: 20/100 Bilateral Distance: 20/100  Right Eye Near:   Left Eye Near:    Bilateral Near:     Physical Exam Constitutional:      General: She is not in acute distress.    Appearance: Normal appearance. She is not toxic-appearing or diaphoretic.     Comments: Patient covering eyes on exam due to photosensitivity.  HENT:     Head: Normocephalic and atraumatic.  Eyes:     General: Lids are normal. Lids are everted, no foreign bodies appreciated. Gaze aligned appropriately.     Extraocular Movements: Extraocular movements intact.     Conjunctiva/sclera: Conjunctivae normal.     Pupils: Pupils are equal, round, and reactive to light.     Comments: There is mild scleral redness to right eye.  Pulmonary:     Effort: Pulmonary effort is normal.  Neurological:     General: No focal deficit present.     Mental Status: She is alert and oriented to person,  place, and time. Mental status is at baseline.  Psychiatric:        Mood and Affect: Mood normal.        Behavior: Behavior normal.        Thought Content: Thought content normal.        Judgment: Judgment normal.     UC Treatments / Results  Labs (all labs ordered are listed, but only abnormal results are displayed) Labs Reviewed - No data to display  EKG   Radiology No results found.  Procedures Procedures (including critical care time)  Medications Ordered in UC Medications - No data to display  Initial Impression / Assessment and Plan / UC Course  I have reviewed the triage vital signs and the nursing notes.  Pertinent labs & imaging results that were available during my care of the patient were reviewed by me and considered in my medical decision making (see chart for details).     Unsure of etiology of patient's symptoms.  Patient has severe blurred vision and is not able to see objects in front of her.  Visual acuity very abnormal.  Dr. Eulas Post with on-call pathology was consulted.  He would like to see patient today at 12:30 at his office.  Patient was provided with address and contact information and advised to follow-up today.  Patient voiced understanding and was agreeable with plan. Final Clinical Impressions(s) / UC Diagnoses   Final diagnoses:  Blurred vision     Discharge Instructions      Please go to [facility] today at 12:30 to have further evaluation and management.    ED Prescriptions   None    PDMP not reviewed this encounter.   Teodora Medici, [place] 01/25/22 [phone]

## 2022-01-25 NOTE — Telephone Encounter (Signed)
Dr. Sherrine Maples with on-call ophthalmology called back after seeing patient at his office.  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  He would like for me to prescribe her medications given that she is a Medicaid patient, and it would be difficult for him to receive approval for these prescriptions.  He recommended moxifloxacin 0.5% eyedrops every 2 hours with 1 refill as well as Valtrex 500 mg 3 times a day for 10 days.  Prescriptions sent for patient.

## 2022-01-25 NOTE — Discharge Instructions (Signed)
Please go to [facility] today at 12:30 to have further evaluation and management.

## 2022-01-25 NOTE — ED Triage Notes (Signed)
Pt c/o burning, bilat eyes both photosensitive. Right eyes is blurry and cloudy. Onset ~ 4 days ago. Denies trauma or injury states she just woke up and had these symptoms getting progressively worse.

## 2022-01-25 NOTE — Telephone Encounter (Signed)
Attempted to contact patient to advise that Heather Kim did send in her prescriptions to the pharmacy on file today.  Patient's phone is going to straight to vm, unable to leave a message.
